# Patient Record
Sex: Female | Born: 1945 | Race: White | Marital: Married | Smoking: Former smoker
Health system: Southern US, Community
[De-identification: ages and names within clinical notes are randomized; demographics above are authoritative.]

## PROBLEM LIST (undated history)

## (undated) DIAGNOSIS — K56609 Unspecified intestinal obstruction, unspecified as to partial versus complete obstruction: Secondary | ICD-10-CM

## (undated) DIAGNOSIS — G4733 Obstructive sleep apnea (adult) (pediatric): Secondary | ICD-10-CM

## (undated) DIAGNOSIS — G4730 Sleep apnea, unspecified: Secondary | ICD-10-CM

## (undated) DIAGNOSIS — T7840XA Allergy, unspecified, initial encounter: Secondary | ICD-10-CM

## (undated) DIAGNOSIS — I4510 Unspecified right bundle-branch block: Secondary | ICD-10-CM

## (undated) DIAGNOSIS — I10 Essential (primary) hypertension: Secondary | ICD-10-CM

## (undated) DIAGNOSIS — M797 Fibromyalgia: Secondary | ICD-10-CM

## (undated) DIAGNOSIS — H409 Unspecified glaucoma: Secondary | ICD-10-CM

## (undated) DIAGNOSIS — F419 Anxiety disorder, unspecified: Secondary | ICD-10-CM

## (undated) DIAGNOSIS — M8580 Other specified disorders of bone density and structure, unspecified site: Secondary | ICD-10-CM

## (undated) DIAGNOSIS — M1990 Unspecified osteoarthritis, unspecified site: Secondary | ICD-10-CM

## (undated) DIAGNOSIS — F319 Bipolar disorder, unspecified: Secondary | ICD-10-CM

## (undated) DIAGNOSIS — E785 Hyperlipidemia, unspecified: Secondary | ICD-10-CM

## (undated) HISTORY — DX: Hyperlipidemia, unspecified: E78.5

## (undated) HISTORY — DX: Other specified disorders of bone density and structure, unspecified site: M85.80

## (undated) HISTORY — PX: CHOLECYSTECTOMY: SHX55

## (undated) HISTORY — PX: COLONOSCOPY: SHX174

## (undated) HISTORY — PX: GASTRIC BYPASS: SHX52

## (undated) HISTORY — PX: TUBAL LIGATION: SHX77

## (undated) HISTORY — DX: Unspecified osteoarthritis, unspecified site: M19.90

## (undated) HISTORY — DX: Obstructive sleep apnea (adult) (pediatric): G47.33

## (undated) HISTORY — DX: Fibromyalgia: M79.7

## (undated) HISTORY — PX: CATARACT EXTRACTION: SUR2

## (undated) HISTORY — DX: Anxiety disorder, unspecified: F41.9

## (undated) HISTORY — DX: Unspecified intestinal obstruction, unspecified as to partial versus complete obstruction: K56.609

## (undated) HISTORY — DX: Bipolar disorder, unspecified: F31.9

## (undated) HISTORY — DX: Sleep apnea, unspecified: G47.30

## (undated) HISTORY — DX: Essential (primary) hypertension: I10

## (undated) HISTORY — DX: Unspecified glaucoma: H40.9

## (undated) HISTORY — DX: Unspecified right bundle-branch block: I45.10

## (undated) HISTORY — DX: Allergy, unspecified, initial encounter: T78.40XA

---

## 2011-09-04 ENCOUNTER — Inpatient Hospital Stay (HOSPITAL_COMMUNITY)
Admission: EM | Admit: 2011-09-04 | Discharge: 2011-09-05 | DRG: 390 | Disposition: A | Discharge status: Home / Self Care | Payer: No Typology Code available for payment source | Attending: General Surgery | Admitting: General Surgery

## 2011-09-04 ENCOUNTER — Emergency Department (HOSPITAL_COMMUNITY): Payer: No Typology Code available for payment source

## 2011-09-04 DIAGNOSIS — IMO0001 Reserved for inherently not codable concepts without codable children: Secondary | ICD-10-CM | POA: Diagnosis present

## 2011-09-04 DIAGNOSIS — Z87891 Personal history of nicotine dependence: Secondary | ICD-10-CM

## 2011-09-04 DIAGNOSIS — I10 Essential (primary) hypertension: Secondary | ICD-10-CM | POA: Diagnosis present

## 2011-09-04 DIAGNOSIS — R109 Unspecified abdominal pain: Secondary | ICD-10-CM

## 2011-09-04 DIAGNOSIS — G4733 Obstructive sleep apnea (adult) (pediatric): Secondary | ICD-10-CM | POA: Diagnosis present

## 2011-09-04 DIAGNOSIS — K56609 Unspecified intestinal obstruction, unspecified as to partial versus complete obstruction: Principal | ICD-10-CM | POA: Diagnosis present

## 2011-09-04 DIAGNOSIS — F319 Bipolar disorder, unspecified: Secondary | ICD-10-CM | POA: Diagnosis present

## 2011-09-04 LAB — DIFFERENTIAL
Basophils Absolute: 0 10*3/uL (ref 0.0–0.1)
Basophils Relative: 0 % (ref 0–1)
Eosinophils Absolute: 0.1 10*3/uL (ref 0.0–0.7)
Eosinophils Relative: 1 % (ref 0–5)
Monocytes Absolute: 0.7 10*3/uL (ref 0.1–1.0)
Monocytes Relative: 7 % (ref 3–12)

## 2011-09-04 LAB — COMPREHENSIVE METABOLIC PANEL
ALT: 18 U/L (ref 0–35)
Albumin: 4 g/dL (ref 3.5–5.2)
Calcium: 9.9 mg/dL (ref 8.4–10.5)
GFR calc Af Amer: >60 mL/min (ref >60)
Glucose, Bld: 111 mg/dL — ABNORMAL HIGH (ref 70–99)
Potassium: 4 mEq/L (ref 3.5–5.1)
Sodium: 139 mEq/L (ref 135–145)
Total Protein: 7.7 g/dL (ref 6.0–8.3)

## 2011-09-04 LAB — URINALYSIS, ROUTINE W REFLEX MICROSCOPIC
Bilirubin Urine: NEGATIVE
Hgb urine dipstick: NEGATIVE
Nitrite: NEGATIVE
Protein, ur: NEGATIVE mg/dL
Specific Gravity, Urine: 1.034 — ABNORMAL HIGH (ref 1.005–1.030)
Urobilinogen, UA: 0.2 mg/dL (ref 0.0–1.0)

## 2011-09-04 LAB — CBC
Hemoglobin: 13.9 g/dL (ref 12.0–15.0)
MCH: 30.5 pg (ref 26.0–34.0)
MCHC: 34.4 g/dL (ref 30.0–36.0)
Platelets: 346 10*3/uL (ref 150–400)
RDW: 13 % (ref 11.5–15.5)

## 2011-09-04 MED ORDER — IOHEXOL 300 MG/ML  SOLN
100.0000 mL | Freq: Once | INTRAMUSCULAR | Status: AC | PRN
  Administered 2011-09-04: 100 mL via INTRAVENOUS

## 2011-09-05 ENCOUNTER — Inpatient Hospital Stay (HOSPITAL_COMMUNITY): Payer: No Typology Code available for payment source

## 2011-09-05 LAB — DIFFERENTIAL
Basophils Absolute: 0 10*3/uL (ref 0.0–0.1)
Lymphocytes Relative: 24 % (ref 12–46)
Lymphs Abs: 1.2 10*3/uL (ref 0.7–4.0)
Monocytes Absolute: 0.5 10*3/uL (ref 0.1–1.0)
Neutro Abs: 3.2 10*3/uL (ref 1.7–7.7)

## 2011-09-05 LAB — CBC
HCT: 35.1 % — ABNORMAL LOW (ref 36.0–46.0)
Hemoglobin: 11.9 g/dL — ABNORMAL LOW (ref 12.0–15.0)
MCHC: 33.9 g/dL (ref 30.0–36.0)
MCV: 88.6 fL (ref 78.0–100.0)
WBC: 5.2 10*3/uL (ref 4.0–10.5)

## 2011-09-05 LAB — BASIC METABOLIC PANEL
BUN: 7 mg/dL (ref 6–23)
Chloride: 105 mEq/L (ref 96–112)
Glucose, Bld: 123 mg/dL — ABNORMAL HIGH (ref 70–99)
Potassium: 3.7 mEq/L (ref 3.5–5.1)

## 2011-09-11 NOTE — Discharge Summary (Signed)
NAMECIMONE, FAHEY                 ACCOUNT NO.:  [id number]  MEDICAL RECORD NO.:  [id number]  LOCATION:  1523                         FACILITY:  [facility]  PHYSICIAN:  Sandria Bales. Ezzard Standing, M.D.  DATE OF BIRTH:  1946/02/27  DATE OF ADMISSION:  09/04/2011 DATE OF DISCHARGE:  09/05/2011                              DISCHARGE SUMMARY   PRIMARY CARE PHYSICIAN:  Dr. Corbin Ade in [place], [place].  ADMISSION DIAGNOSIS:  Abdominal pain with small bowel obstruction.  DISCHARGE DIAGNOSIS:  Abdominal pain with small bowel obstruction  ADDITIONAL DIAGNOSES: 1. History of bipolar disease with panic attacks. 2. Hiatal hernia/gastroesophageal reflux disease/irritable bowel     syndrome. 3. Fibromyalgia. 4. Sleep apnea, she is intolerant to CPAP. 5. BMI of 38. 6. Status post cholecystectomy, status post gastric banding with     reversal and C-section x2.  BRIEF HISTORY:  The patient is a very pleasant 65 year old female who developed some cramping and some bloating, Friday of last week.  She is able to eat some on Saturday.  Her symptoms persisted and got worse Sunday, was worse than Saturday.  She eventually went to bed and woke up, Monday a.m., around 3:30 with nausea and vomiting.  She had a great deal of discomfort, trouble lying down.  She finally got back to bed and woke up again at 5:30 a.m., vomiting again.  Her husband was actually brought to [facility] this morning for an elective nephrectomy for a renal cancer.  Both children are with their parents and the kids brought their mother to the emergency room and their dad went to the OR for surgery.  She has had no further vomiting since 5:30 a.m.  She had a bowel movement early this morning before her first CT and both Saturday and Sunday prior to admission.  MEDICATIONS ON ADMISSION: 1. Ultram 50 mg q.i.d. p.r.n. 2. Hydrocodone 7.5/325 p.r.n. 3. Equetro 800 mg p.o. h.s. 4. Naprosyn 500 mg b.i.d. p.r.n. 5. Zantac p.r.n., she  has just started this. 6. Losartan 50 mg b.i.d. 7. Ambien 10 mg h.s. 8. Metoprolol ER 100 mg h.s. 9. Lamictal 200 mg p.o. h.s. 10.Phenergan 25 mg half tablet q.6 p.r.n. 11.Valium 5 mg 1-2 p.o. b.i.d. p.r.n. for anxiety.  HOSPITAL COURSE:  The patient is a 65 year old female who presented to the ER with nausea and vomiting.  X-rays in the ER showed dilated small bowel loops in the left upper quadrant with air-fluid levels.  She then underwent a CT scan, which showed a small bowel obstruction within the left lower quadrant, left pelvis, most likely due to adhesions.  There is no mass that was seen.  There was no small hiatal hernia and there were multiple rectosigmoid diverticula.  No diverticulitis.  She was seen in the ER by Dr. Ezzard Standing and admitted for small bowel obstruction. She was placed on the floor, she had no further nausea and vomiting, so an NG was not inserted.  She was placed on bowel rest and IV hydration. She did well overnight and had a bowel movement this morning.  A repeat 2-view abdomen was obtained, which showed interval resolution of dilated small bowel in the left side  of the abdomen, bowel pattern was normal. There was residual oral contrast from the CT within the decompressed colon.  Surgical clips in the right upper quadrant from the prior cholecystectomy, surgical anastomotic suture material in the left upper quadrant from a previous gastric bypass.  There was no evidence of intraperitoneal air or air-fluid levels.  She does have degenerative changes in the lower lumbar spine and the right sacroiliac joint.  The patient was seen in the a.m. of September 05, 2011, by Dr. Ezzard Standing, she was doing well and had a bowel movement and her x-ray was improved.  He placed her on liquids.  She has been able to tolerate some clear liquids, she had a little bit of cream soup.    She is tolerating this all well.  Her children need to back to work and they would like to go  home where she can resume her fibromyalgia regime.  She will plan to come back tomorrow with friends, if she is doing well, to see her husband. She was instructed to maintain a full liquid diet in the next 48-72 hours and then low-residual diet for at least a week after that until she is completely back to normal.  I recommended she call Dr. Nelson Chimes for followup in [place] as soon she gets home tomorrow.  We will see her back on a p.r.n. basis should she needs.  DISCHARGE MEDICATIONS:  She is going to resume all her preadmission meds as before, 1. Carbamazepine XR 200 mg 4 tablets h.s. 2. Hydrocodone 7.5/500, one 5 times a day as needed. 3. Lamotrigine 200 mg 2 tablets daily h.s. 4. Losartan 50 mg h.s. 5. Metoprolol XL 100 mg daily. 6. Naprosyn 500 mg 1 tablet b.i.d. p.r.n. 7. She has nystatin and steroid cream she applies on her arms daily. 8. Tramadol 50 mg 2 tablets p.r.n. for pain. 9. Valium 5 mg 1-2 t.i.d. p.r.n. 10.Zantac 75 mg daily.  CONDITION ON DISCHARGE:  Improved.   Eber Hong, P.A.   Sandria Bales. Ezzard Standing, M.D., FACS   WDJ/MEDQ  D:  09/05/2011  T:  09/06/2011  Job:  [id number]  cc:   Dr. Henrietta Dine, [place]  Electronically Signed by Sherrie George P.A. on 09/07/2011 10:19:07 PM Electronically Signed by Ovidio Kin M.D. on 09/11/2011 09:17:54 AM

## 2011-09-11 NOTE — H&P (Signed)
[name], [name]                 ACCOUNT NO.:  [id number]  MEDICAL RECORD NO.:  [id number]  LOCATION:  1523                         FACILITY:  [facility]  PHYSICIAN:  Sandria Bales. Ezzard Standing, M.D.  DATE OF BIRTH:  05-21-1946  DATE OF ADMISSION:  09/04/2011                             HISTORY & PHYSICAL   REQUESTING PHYSICIAN:  Trudi Ida. Denton Lank, M.D.  REASON FOR ADMISSION:  Abdominal pain with small bowel obstruction.  BRIEF HISTORY:  The patient is a 65 year old white female who developed some crampiness and bloated feeling on Friday.  She was able to eat some on Saturday, but the symptoms persisted and got worse, Sunday she was even worse than Saturday.  She ultimately went to bed, but woke up around 3:30 a.m. this morning with nausea and vomiting.  It hurts her bad, she had trouble lying down.  She vomited again this morning around 5:30 a.m.  She has not eaten since 3:30 a.m.  Her husband actually was brought to [facility] this morning for elective nephrectomy secondary to renal cancer.  Both children are with their parents and the kids brought her to the emergency room here at [facility] first.  The patient's pain and nausea have resolved.  She has had no further vomiting since 5:30 a.m.  She had bowel movements, both on Saturday and Sunday and reports she actually had one this morning before she had the CT scan.  PAST MEDICAL HISTORY: 1. Bipolar. 2. History of fibromyalgia. 3. Panic attacks. 4. Irritable bowel syndrome, mostly related to stress. 5. Hypertension. 6. History of palpitations with what sounds like a normal stress test. 7. Obstructive sleep apnea.  She had a CPAP, but with panic attacks     and bipolar she cannot use it.  She has on average about 1 panic     attack per month.  PAST SURGICAL HISTORY: 1. Cholecystectomy. 2. 2 C-sections. 3. Gastric banding reversal. 4. History of tubal ligations.  FAMILY HISTORY:  Father died at 59 with diabetes and  coronary artery disease.  Mother died at 56 with pneumonia and had lobectomy, psychiatric issues.  No brothers or sisters.  SOCIAL HISTORY:  Tobacco, she smoked 31 years, less than a pack a day, none for the last 13 years.  Alcohol, social.  Drugs none.  She is a retired [employer] officer.  REVIEW OF SYSTEMS:   FEVER:  None.  CV:  Positive for headache today, otherwise negative.  No history of stroke, seizure, syncope, or presyncope.  WEIGHT:  She says she actually slowly gaining weight. PSYCH:  Positive for bipolar disease somewhat worse with stress. CARDIAC:  No chest pain.  No palpitations.   PULMONARY:  No orthopnea. No PND.  She does have dyspnea on exertion.  No coughing or wheezing. No recent URI.  GI:  Positive for nausea and vomiting.  Positive for diarrhea when she is anxious.  No constipation.  Positive for GERD.  No blood in her stool.  GU:  No trouble voiding.   LOWER EXTREMITIES: Positive for edema, she thinks.  No claudication.  She has pain in her knees.  She has mild arthritis and fibromyalgia, which she says aches  all over. CURRENT MEDICATIONS: 1. Ultram 50 mg p.o. q.i.d. p.r.n. 2. Hydrocodone 7.5/325 q.4 p.r.n. 3. Equetro 800 mg p.o. h.s. 4. Naprosyn 500 mg b.i.d. p.r.n. 5. Zantac, she just started p.r.n. 6. Losartan 50 mg daily. 7. Ambien 10 mg h.s. 8. Metoprolol ER 100 mg daily. 9. Lamictal 200 mg p.o. h.s. 10.Phenergan 25 mg half tablet q.6 p.r.n. 11.Valium 5 mg 1-2 p.o. b.i.d. p.r.n. anxiety.  ALLERGIES:  PENICILLIN.  PHYSICAL EXAMINATION:  GENERAL:  This is a well-nourished, well- developed white female, in no acute distress. VITAL SIGNS:  Temperature is 98.3, heart rate is 71, blood pressure is 136/73, respiratory rate is 16, sats are 100% on room air. HEAD:  Normocephalic. EYES, EARS, NOSE, THROAT, MOUTH:  Grossly within normal limits.  Mucosa is normal.  Trachea is in the midline.  Thyroid is nonpalpable.  There is no JVD.  No bruits. CHEST:  Clear to  auscultation and percussion.  Nontender to palpation. CARDIAC:  Normal S1, S2.  No murmurs, rubs, or gallops. ABDOMEN:  Soft.  Positive bowel sounds.  No palpable hepatosplenomegaly. Abdomen is currently nontender.  There are no hernia, masses, or abscesses.  There are midline incision scars above and below the umbilicus.  GU/RECTAL:  Deferred. LYMPH:  Lymphadenopathy nonpalpated, axillary, cervical, or femoral. SKIN:  No significant changes. NEUROLOGIC:  No focal deficits.  Cranial nerves II through XII are grossly intact. PSYCH:  Normal affect.  LABORATORY DATA:  White count is 9.1, hemoglobin is 13.9, hematocrit is 40.4, and platelets are 346,000.  Sodium is 139, potassium is 4.0, chloride is 102, CO2 is 25, BUN is 15, creatinine is 0.56, glucose is 111, total bilirubin 0.3, alk phos 103, SGOT/SGPT are both 18.  UA is normal.  IMAGING STUDIES:  CT shows bibasilar atelectasis.  There is a small hiatal hernia.  The liver, kidneys, pancreas are normal.  She has clips from prior cholecystectomy, a small bowel is dilated in the left upper quadrant and upper pelvis.  There are multiple rectosigmoid diverticuli. It was their impression she had a small bowel obstruction secondary to adhesions.  IMPRESSION: 1. Small bowel obstruction. 2. History of bipolar disease with panic attacks. 3. Hiatal hernia/gastroesophageal reflux disease/irritable bowel     syndrome. 4. Fibromyalgia. 5. Obstructive sleep apnea.  She is intolerant to CPAP. 6. BMI of 38. 7. Status post cholecystectomy, gastric banding with reversal and C-     section x2.  PLAN:  We are going to keep her n.p.o., she has recurrent nausea or vomiting, we will place an NG, we will put her on bowel rest, IV fluids, and IV meds with further treatment as indicated.   Eber Hong, P.A.   Sandria Bales. Ezzard Standing, M.D., FACS   WDJ/MEDQ  D:  09/04/2011  T:  09/04/2011  Job:  [id number]  Electronically Signed by Sherrie George  P.A. on 09/07/2011 10:16:44 PM Electronically Signed by Ovidio Kin M.D. on 09/11/2011 09:17:05 AM

## 2011-09-18 ENCOUNTER — Observation Stay (HOSPITAL_COMMUNITY)
Admission: EM | Admit: 2011-09-18 | Discharge: 2011-09-20 | Disposition: A | Discharge status: Home / Self Care | Payer: No Typology Code available for payment source | Attending: Surgery | Admitting: Surgery

## 2011-09-18 ENCOUNTER — Inpatient Hospital Stay (HOSPITAL_COMMUNITY): Payer: No Typology Code available for payment source

## 2011-09-18 ENCOUNTER — Emergency Department (HOSPITAL_COMMUNITY): Payer: No Typology Code available for payment source

## 2011-09-18 DIAGNOSIS — R142 Eructation: Secondary | ICD-10-CM | POA: Insufficient documentation

## 2011-09-18 DIAGNOSIS — R109 Unspecified abdominal pain: Secondary | ICD-10-CM | POA: Insufficient documentation

## 2011-09-18 DIAGNOSIS — G4733 Obstructive sleep apnea (adult) (pediatric): Secondary | ICD-10-CM | POA: Insufficient documentation

## 2011-09-18 DIAGNOSIS — IMO0001 Reserved for inherently not codable concepts without codable children: Secondary | ICD-10-CM | POA: Insufficient documentation

## 2011-09-18 DIAGNOSIS — K449 Diaphragmatic hernia without obstruction or gangrene: Secondary | ICD-10-CM | POA: Insufficient documentation

## 2011-09-18 DIAGNOSIS — I10 Essential (primary) hypertension: Secondary | ICD-10-CM | POA: Insufficient documentation

## 2011-09-18 DIAGNOSIS — F319 Bipolar disorder, unspecified: Secondary | ICD-10-CM | POA: Insufficient documentation

## 2011-09-18 DIAGNOSIS — Z79899 Other long term (current) drug therapy: Secondary | ICD-10-CM | POA: Insufficient documentation

## 2011-09-18 DIAGNOSIS — K56609 Unspecified intestinal obstruction, unspecified as to partial versus complete obstruction: Principal | ICD-10-CM | POA: Insufficient documentation

## 2011-09-18 DIAGNOSIS — R141 Gas pain: Secondary | ICD-10-CM | POA: Insufficient documentation

## 2011-09-18 DIAGNOSIS — Z9884 Bariatric surgery status: Secondary | ICD-10-CM | POA: Insufficient documentation

## 2011-09-18 DIAGNOSIS — R112 Nausea with vomiting, unspecified: Secondary | ICD-10-CM | POA: Insufficient documentation

## 2011-09-18 DIAGNOSIS — F410 Panic disorder [episodic paroxysmal anxiety] without agoraphobia: Secondary | ICD-10-CM | POA: Insufficient documentation

## 2011-09-18 LAB — DIFFERENTIAL
Basophils Absolute: 0 K/uL (ref 0.0–0.1)
Basophils Relative: 0 % (ref 0–1)
Eosinophils Absolute: 0 K/uL (ref 0.0–0.7)
Eosinophils Relative: 0 % (ref 0–5)
Lymphocytes Relative: 11 % — ABNORMAL LOW (ref 12–46)
Lymphs Abs: 1.1 10*3/uL (ref 0.7–4.0)
Monocytes Absolute: 0.6 10*3/uL (ref 0.1–1.0)
Monocytes Relative: 5 % (ref 3–12)
Neutro Abs: 8.9 K/uL — ABNORMAL HIGH (ref 1.7–7.7)
Neutrophils Relative %: 83 % — ABNORMAL HIGH (ref 43–77)

## 2011-09-18 LAB — CBC
HCT: 42.6 % (ref 36.0–46.0)
Hemoglobin: 14.5 g/dL (ref 12.0–15.0)
MCH: 30.3 pg (ref 26.0–34.0)
MCHC: 34 g/dL (ref 30.0–36.0)
MCV: 89.1 fL (ref 78.0–100.0)
Platelets: 383 K/uL (ref 150–400)
RBC: 4.78 MIL/uL (ref 3.87–5.11)
RDW: 12.8 % (ref 11.5–15.5)
WBC: 10.7 K/uL — ABNORMAL HIGH (ref 4.0–10.5)

## 2011-09-18 LAB — URINALYSIS, ROUTINE W REFLEX MICROSCOPIC
Bilirubin Urine: NEGATIVE
Glucose, UA: NEGATIVE mg/dL
Hgb urine dipstick: NEGATIVE
Ketones, ur: NEGATIVE mg/dL
Leukocytes, UA: NEGATIVE
Nitrite: NEGATIVE
Protein, ur: NEGATIVE mg/dL
Specific Gravity, Urine: 1.018 (ref 1.005–1.030)
Urobilinogen, UA: 0.2 mg/dL (ref 0.0–1.0)
pH: 7.5 (ref 5.0–8.0)

## 2011-09-18 LAB — COMPREHENSIVE METABOLIC PANEL WITH GFR
ALT: 16 U/L (ref 0–35)
AST: 22 U/L (ref 0–37)
Alkaline Phosphatase: 106 U/L (ref 39–117)
CO2: 30 meq/L (ref 19–32)
Calcium: 10.6 mg/dL — ABNORMAL HIGH (ref 8.4–10.5)
Chloride: 99 meq/L (ref 96–112)
GFR calc Af Amer: >90 mL/min (ref >90)
GFR calc non Af Amer: 88 mL/min — ABNORMAL LOW (ref >90)
Glucose, Bld: 108 mg/dL — ABNORMAL HIGH (ref 70–99)
Potassium: 4.2 meq/L (ref 3.5–5.1)
Sodium: 139 meq/L (ref 135–145)

## 2011-09-18 LAB — LIPASE, BLOOD: Lipase: 21 U/L (ref 11–59)

## 2011-09-18 LAB — COMPREHENSIVE METABOLIC PANEL
Albumin: 4.1 g/dL (ref 3.5–5.2)
BUN: 15 mg/dL (ref 6–23)
Creatinine, Ser: 0.73 mg/dL (ref 0.50–1.10)
Total Bilirubin: 0.3 mg/dL (ref 0.3–1.2)
Total Protein: 8.3 g/dL (ref 6.0–8.3)

## 2011-09-19 ENCOUNTER — Inpatient Hospital Stay (HOSPITAL_COMMUNITY): Payer: No Typology Code available for payment source

## 2011-10-09 NOTE — H&P (Signed)
NAMEANALI, CABANILLA                 ACCOUNT NO.:  [id number]  MEDICAL RECORD NO.:  [id number]  LOCATION:  1526                         FACILITY:  [facility]  PHYSICIAN:  Anselm Pancoast. Kamareon Sciandra, M.D.DATE OF BIRTH:  1946/06/30  DATE OF ADMISSION:  09/18/2011 DATE OF DISCHARGE:                             HISTORY & PHYSICAL   PRIMARY CARE PHYSICIAN:  Dr. Corbin Ade, [place].  CHIEF COMPLAINT:  Nausea, vomiting, and abdominal pain.  HISTORY OF PRESENT ILLNESS:  [name] is a 65 year old white female, who has had a recent partial small bowel obstruction with admission here at [facility] approximately 2 weeks ago.  She has had multiple previous abdominal surgeries including gastric bypass surgery and ultimate reversal.  Apparently last time, she was only here for approximately 24 to 48 hours and dramatically improved and was discharged.  Last night around the 11:00 pm, the patient developed what she describesas crampy upper abdominal pain.  This kept her up all night and around 5:30 this morning, the patient developed emesis.  It eventually was bilious in nature.  She is still passing flatus and has actually had 2 soft bowel movements while here in the emergency department.  Upon arrival, she did have x-rays obtained, which revealed a partial small bowel obstruction.  Because of this, we have been asked to evaluate the patient for admission.  REVIEW OF SYSTEMS:  Please see HPI, otherwise all other systems have been reviewed and are negative.  FAMILY HISTORY:  Noncontributory.  PAST MEDICAL HISTORY: 1. Bipolar disorder. 2. Fibromyalgia. 3. Hypertension. 4. Anxiety disorder. 5. Obstructive sleep apnea.  PAST SURGICAL HISTORY: 1. C-section x2. 2. Tubal ligation. 3. Gastric bypass surgery. 4. Gastric bypass reversal with open cholecystectomy at the same time. 5. Bilateral venous ligation of her lower extremities.  SOCIAL HISTORY:  The patient is married with 2  children.  She is retired.  She quit smoking approximately 14 years ago.  She rarely uses alcohol.  She denies any illicit drug abuse.  ALLERGIES:  PENICILLIN.  MEDICATIONS AT HOME:  Include: 1. Carbamazepine 200 mg at bedtime. 2. Vicodin 7.5/500 as needed for pain. 3. Lamotrigine 200 mg at bedtime. 4. Losartan 50 mg daily. 5. Metoprolol tartrate 100 mg daily. 6. Naprosyn 500 mg b.i.d. 7. Nystatin as needed. 8. Ultram 50 mg q.i.d. 9. Valium 5 mg twice a day. 10.Zantac 75 mg p.r.n. 11.Zolpidem 10 mg p.r.n.  PHYSICAL EXAMINATION:  GENERAL:  Ms. Broman is a pleasant 65 year old white female, who is currently lying in bed, in no acute distress. VITAL SIGNS:  Temperature 98.1, pulse 94, blood pressure 130/74, respirations 20. HEENT:  Head is normocephalic, atraumatic.  Sclerae noninjected.  Pupils are equal, round, and reactive to light.  Ears and nose without any obvious masses or lesions.  No rhinorrhea.  Mouth is pink.  Throat shows no exudate. HEART:  Regular rate and rhythm.  Normal S1-S2.  No murmurs, gallops, or rubs are noted.  She does have palpable carotid, radial, and pedal pulses bilaterally. LUNGS:  Clear to auscultation bilaterally with no wheezes, rhonchi, or rales noted.  Respiratory effort is nonlabored. ABDOMEN:  Soft and obese.  She does have almost hyperactive  bowel sounds.  She is, otherwise, nontender and nondistended.  She does have a large upper midline incision from her prior laparotomy.  Otherwise, no masses, hernias, or organomegaly are noted. MUSCULOSKELETAL:  All 4 extremities are symmetrical.  No cyanosis or clubbing.  She does have some mild trace pitting edema of her left lower extremity. SKIN:  Warm and dry with no masses, lesions, or rashes. PSYCH:  The patient is alert and oriented x3 with an appropriate affect.  LABS:  White blood cell count is 10,700, hemoglobin 14.5, hematocrit 42.6, platelet count is 383,000.  Sodium 139, potassium 4.2,  glucose 108, BUN 15, creatinine of 0.73.  DIAGNOSTICS:  X-ray, acute abdominal series reveals partial small bowel obstruction.  IMPRESSION: 1. Recurrent partial small bowel obstruction. 2. Bipolar disorder. 3. Anxiety disorder. 4. Hypertension. 5. Obstructive sleep apnea.  PLAN:  At this time, we will get the patient admitted.  Given the fact that this is her second admission within 2 weeks for a recurrent partial small bowel obstruction.  We will obtain an upper GI with small bowel follow-through to evaluate for chronic stricturing or stenosis from either scar tissue or strictures at her prior anastomoses.  In the meantime, we will keep her NPO, start her on IV fluids.  If we will be able to avoid operative intervention, however, if the patient does not improve, this is the possibility.     Letha Cape, PA   ______________________________ Anselm Pancoast. Zachery Dakins, M.D.    KEO/MEDQ  D:  09/18/2011  T:  09/19/2011  Job:  [id number]  cc:   Corbin Ade, MD  Electronically Signed by Barnetta Chapel PA on 09/28/2011 05:37:49 PM Electronically Signed by Consuello Bossier M.D. on 10/09/2011 11:24:07 AM

## 2012-10-12 IMAGING — CR DG ABDOMEN ACUTE W/ 1V CHEST
4 series · 4 of 4 positions shown · non-contrast
Comparison: None.

CLINICAL DATA: Left upper abdominal pain.  Bilious vomiting.

ACUTE ABDOMEN SERIES (ABDOMEN 2 VIEW & CHEST 1 VIEW)

[w chest pa]
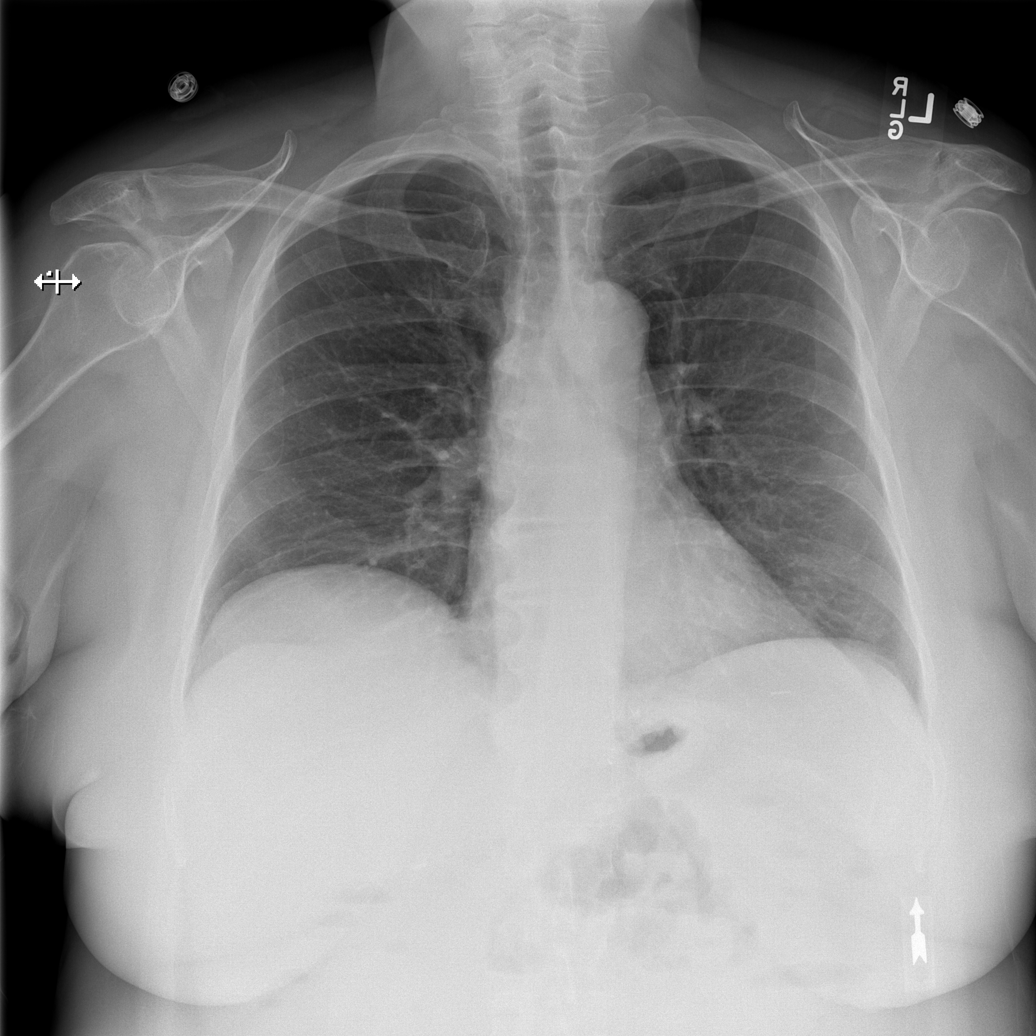

[w abdomen upright]
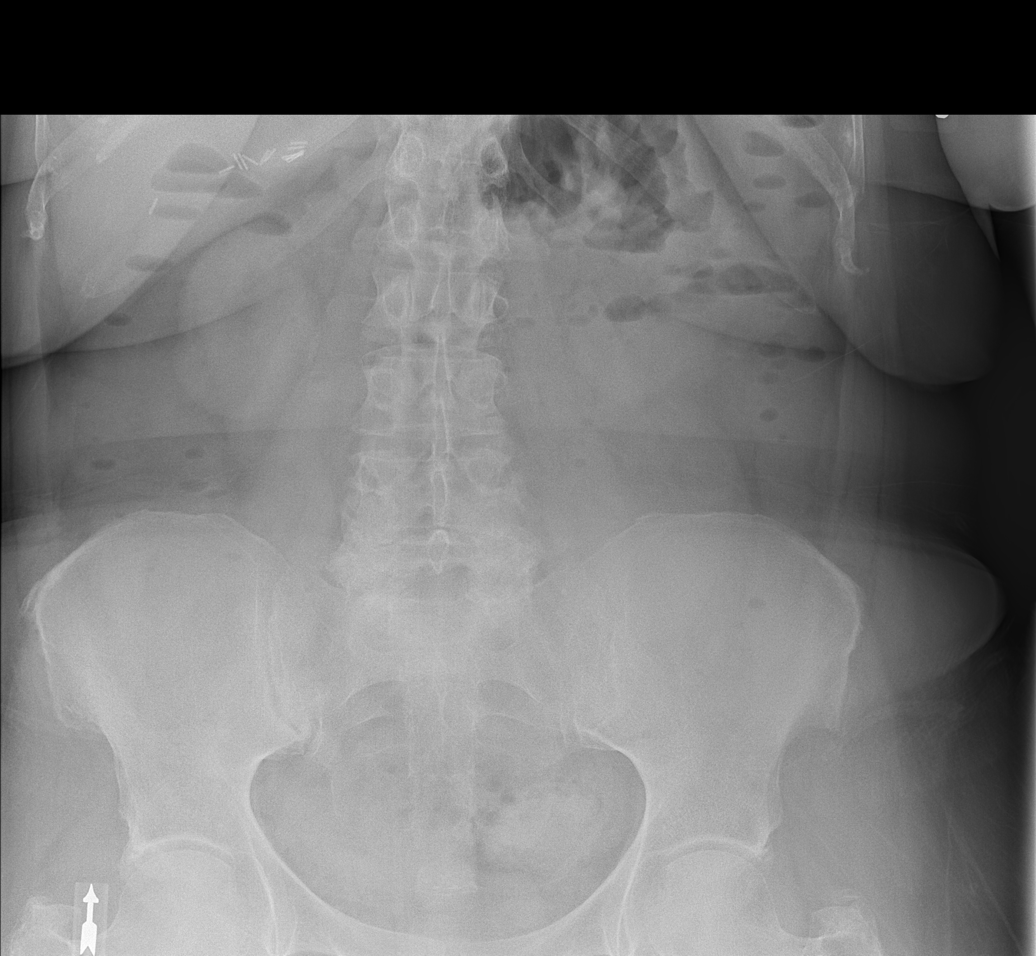

[t abdomen supine (1 of 2)]
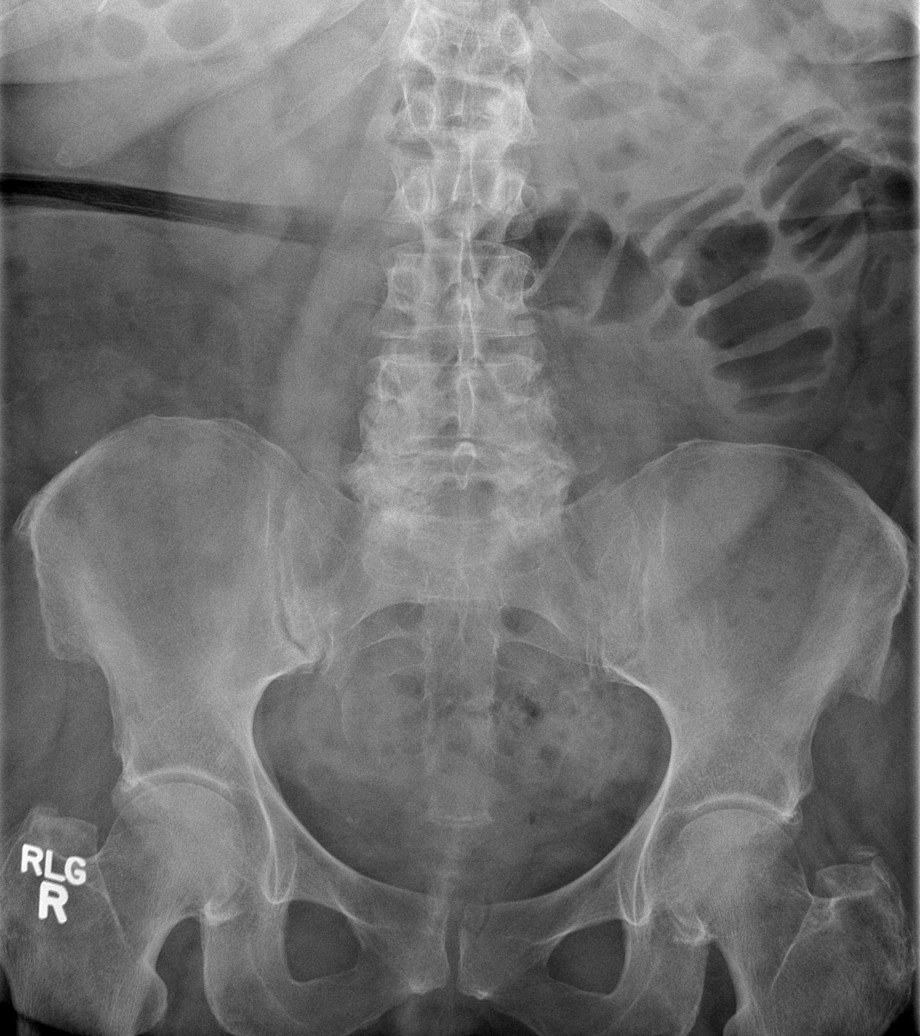

[t abdomen supine (2 of 2)]
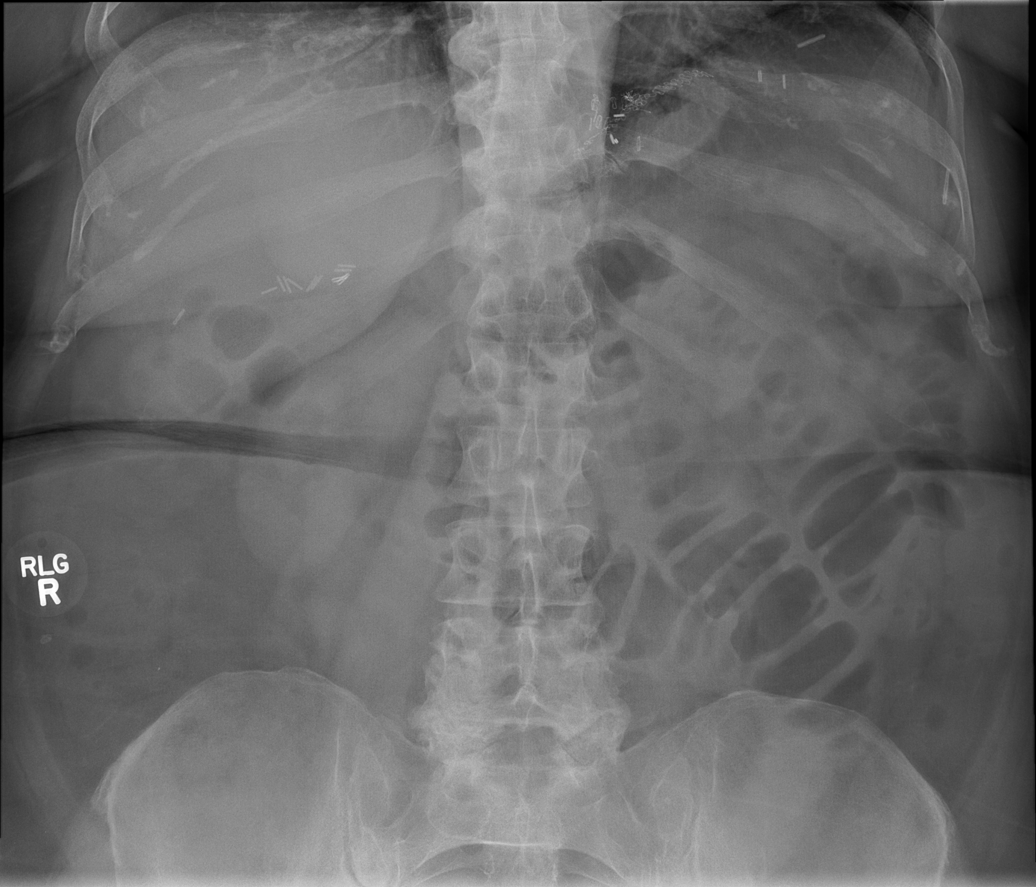

[4 of 4 positions shown; findings below may reference images not displayed]

FINDINGS: Dilated small bowel loops are seen in the left upper
quadrant with air-fluid levels.  Findings concerning for small
bowel obstruction.  Surgical clips in the upper abdomen
bilaterally.  Presumed prior cholecystectomy.  The patient also
reports previous gastric bypass.  No free air organomegaly.  No
suspicious calcification or acute bony abnormality.  Degenerative
changes in the lower lumbar spine and hips.

Heart and mediastinal contours are within normal limits.  No focal
opacities or effusions.  No acute bony abnormality.
IMPRESSION: Dilated left abdominal small bowel loops with air-fluid levels
compatible with small bowel obstruction.  Given the history of
prior gastric bypass, I would recommend CT of the abdomen and
pelvis with IV contrast to exclude internal hernia or closed-loop
obstruction.

## 2012-10-12 IMAGING — CT CT ABD-PELV W/ CM
1 of 3 series · 14 of 32 positions shown, 19 images · IV contrast (APPLIED)
Comparison: Chest and acute abdomen of 09/04/2011

CLINICAL DATA: Epigastric pain, nausea and vomiting, history of
gastric bypass surgery

CT ABDOMEN AND PELVIS WITH CONTRAST
TECHNIQUE: Multidetector CT imaging of the abdomen and pelvis was
performed following the standard protocol during bolus
administration of intravenous contrast.
Contrast: 100mL OMNIPAQUE IOHEXOL 300 MG/ML IV SOLN

[Series 2: abd/pel with · axial · 0.87mm/px · z∈[-279,+126]mm · 14 of 91 slices shown, 19 images]
[im 5/91  soft-tissue]
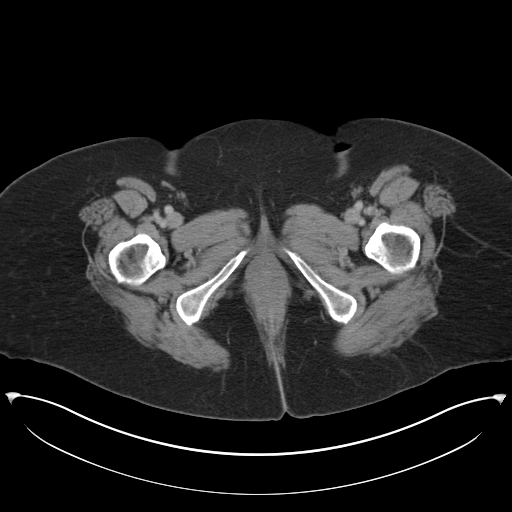
[im 5/91  bone]
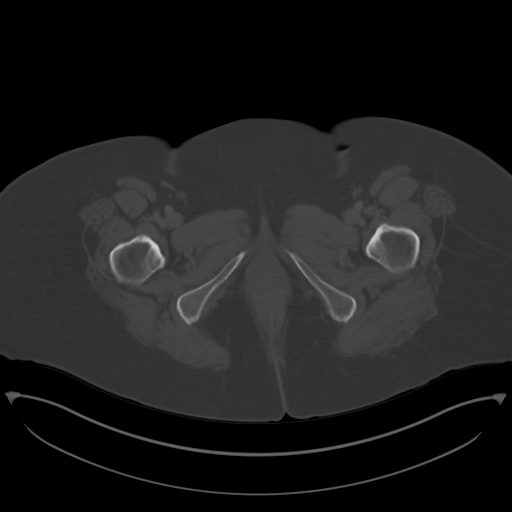
[im 15/91  soft-tissue]
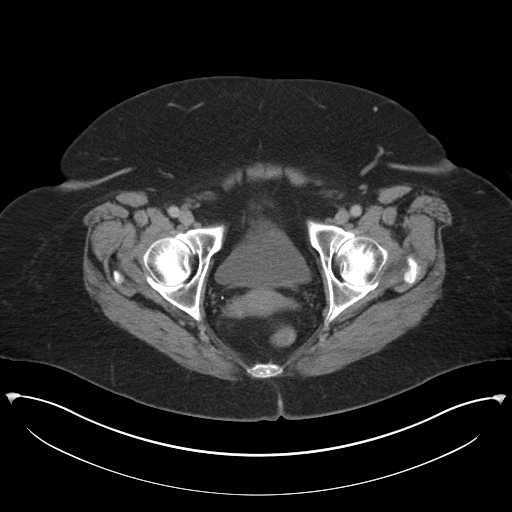
[im 19/91  soft-tissue]
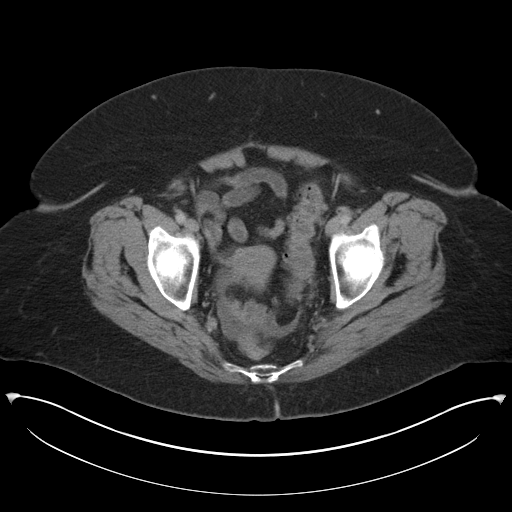
[im 24/91  soft-tissue]
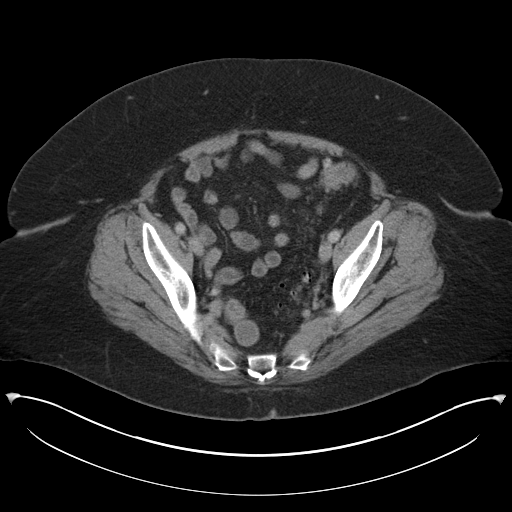
[im 34/91  soft-tissue]
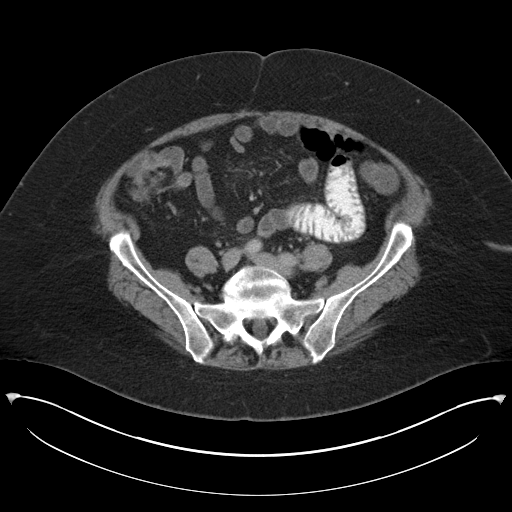
[im 38/91  soft-tissue]
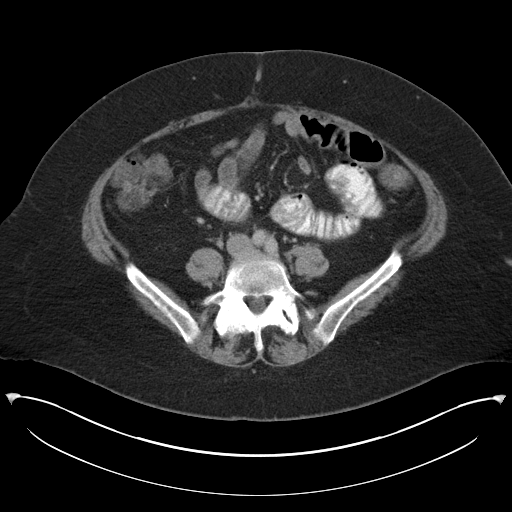
[im 48/91  soft-tissue]
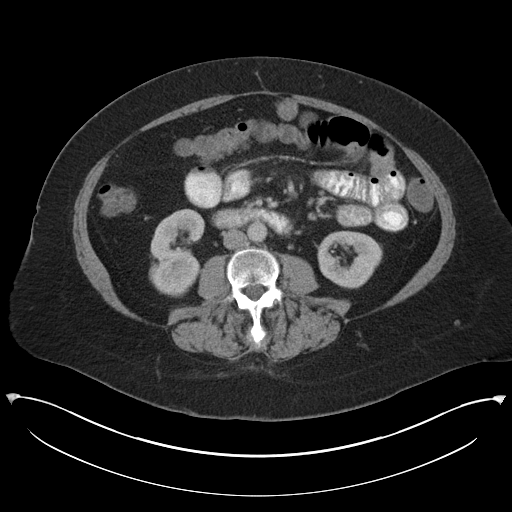
[im 53/91  soft-tissue]
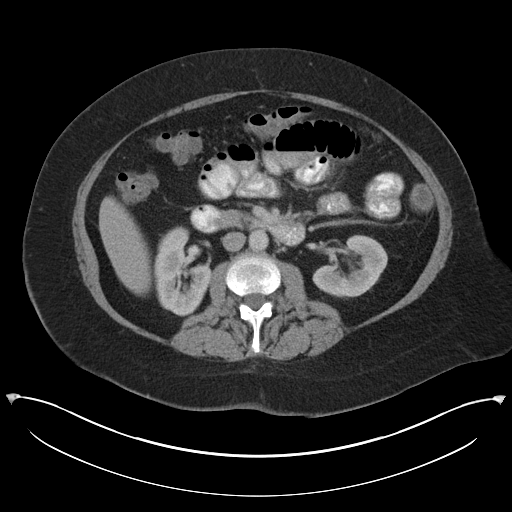
[im 57/91  soft-tissue]
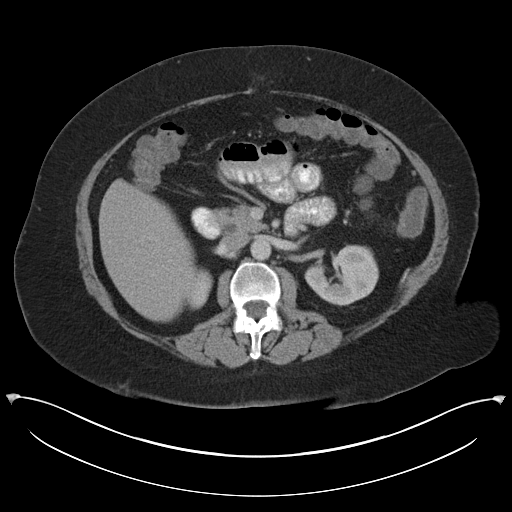
[im 57/91  bone]
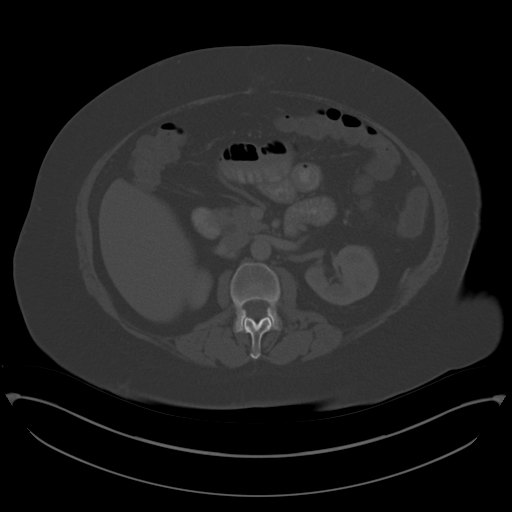
[im 67/91  soft-tissue]
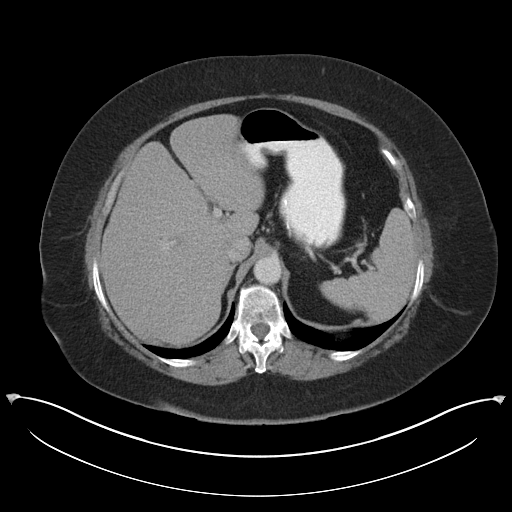
[im 72/91  soft-tissue]
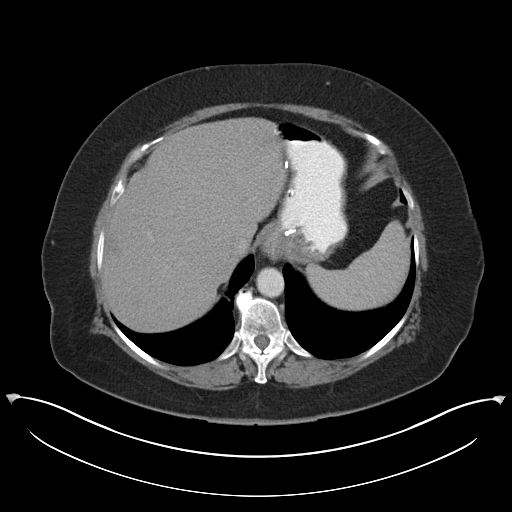
[im 72/91  lung]
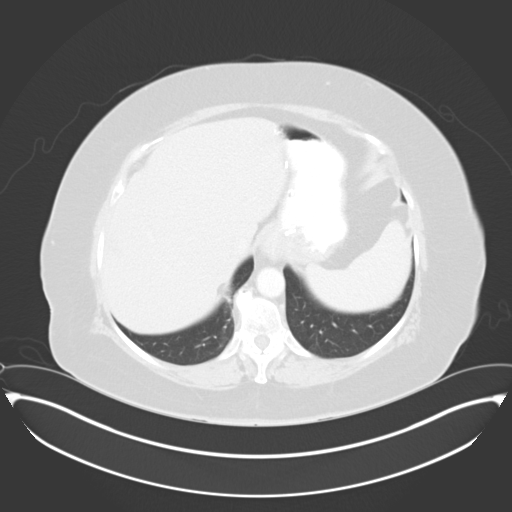
[im 76/91  soft-tissue]
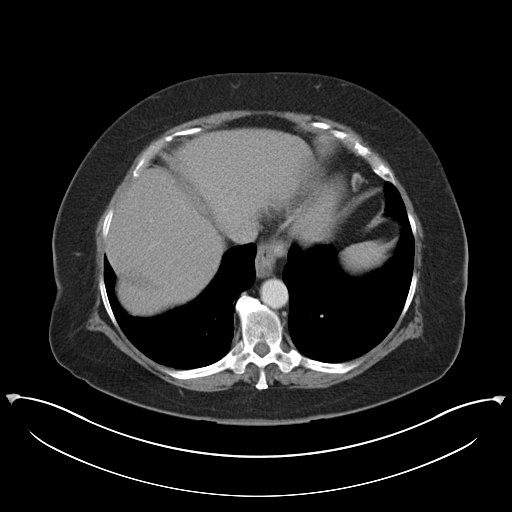
[im 76/91  lung]
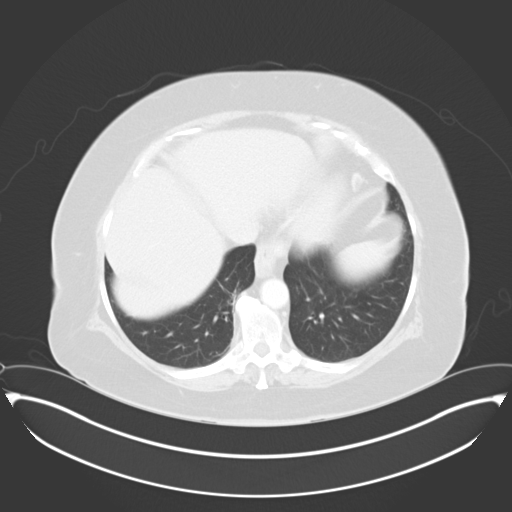
[im 81/91  lung]
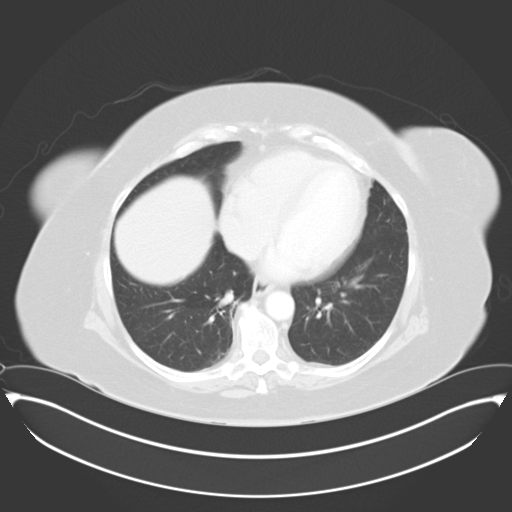
[im 86/91  soft-tissue]
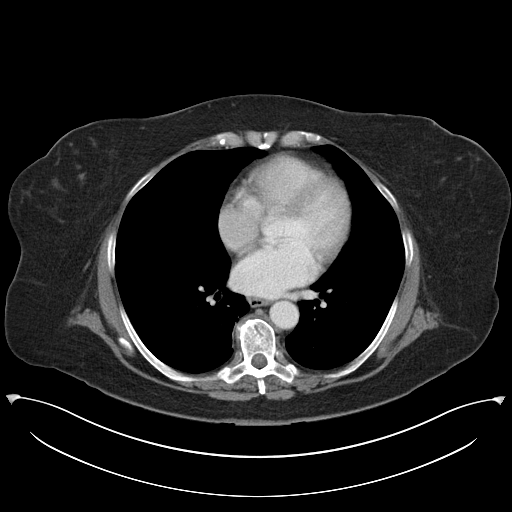
[im 86/91  lung]
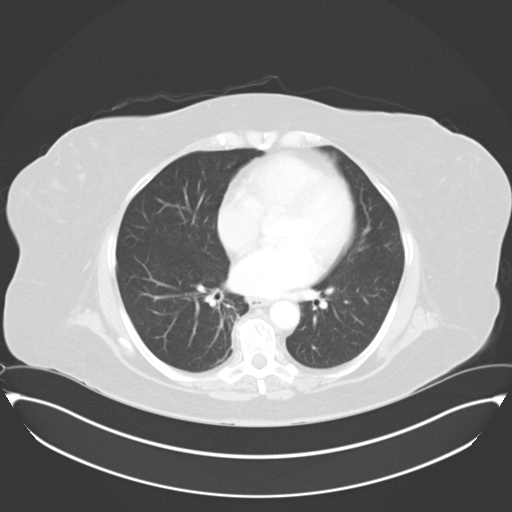

[14 of 32 positions shown; findings below may reference images not displayed]

FINDINGS: The lung bases are clear other than minimal atelectasis
in the lingula. There does appear to be a small hiatal hernia
present.  The liver enhances with no focal abnormality and no
ductal dilatation is seen.  Surgical clips are present from prior
cholecystectomy.  The pancreas is normal in size and the pancreatic
duct is not dilated.  The adrenal glands and spleen are
unremarkable, and a small splenule is noted.  The kidneys enhance
with no calculus or mass and no hydronephrosis is noted.  The
abdominal aorta is normal in caliber.  No adenopathy is seen.

There are dilated loops of small bowel into the left lower quadrant
- left upper pelvis where there does appear to be a change in
caliber.  The exact point of caliber change is difficult to
ascertain, but there is some angulation bowel loops and soft tissue
strandiness in the left lower quadrant and adhesion is the primary
consideration.  The distal small bowel loops are decompressed to
the ileocecal valve, and the colon is not distended.

The urinary bladder is unremarkable.  The uterus is normal in size.
There is some free fluid within the pelvis.  Rectosigmoid colonic
diverticula are noted.  The appendix is unremarkable. There is
degenerative change involving the facet joints of L4-5 and L5-S1.
IMPRESSION: 1.  Small bowel obstruction probably within the left lower quadrant
- left pelvis most likely due to an adhesion.  No mass is seen.
2.  Probable small hiatal hernia.
3.  Multiple rectosigmoid colonic diverticula.

## 2012-10-17 DIAGNOSIS — R413 Other amnesia: Secondary | ICD-10-CM | POA: Insufficient documentation

## 2014-09-04 DIAGNOSIS — R296 Repeated falls: Secondary | ICD-10-CM | POA: Insufficient documentation

## 2014-12-24 DIAGNOSIS — I10 Essential (primary) hypertension: Secondary | ICD-10-CM | POA: Insufficient documentation

## 2016-12-18 ENCOUNTER — Encounter: Payer: Self-pay | Admitting: Physician Assistant

## 2016-12-27 ENCOUNTER — Ambulatory Visit: Payer: No Typology Code available for payment source | Admitting: Physician Assistant

## 2017-02-28 ENCOUNTER — Encounter: Payer: Self-pay | Admitting: Physician Assistant

## 2017-03-06 ENCOUNTER — Encounter (INDEPENDENT_AMBULATORY_CARE_PROVIDER_SITE_OTHER): Payer: Self-pay

## 2017-03-06 ENCOUNTER — Encounter: Payer: Self-pay | Admitting: Physician Assistant

## 2017-03-06 ENCOUNTER — Ambulatory Visit (INDEPENDENT_AMBULATORY_CARE_PROVIDER_SITE_OTHER): Payer: Medicare Other | Admitting: Physician Assistant

## 2017-03-06 VITALS — BP 120/76 | Ht 63.5 in | Wt 198.0 lb

## 2017-03-06 DIAGNOSIS — R110 Nausea: Secondary | ICD-10-CM | POA: Diagnosis not present

## 2017-03-06 DIAGNOSIS — Z1211 Encounter for screening for malignant neoplasm of colon: Secondary | ICD-10-CM | POA: Diagnosis not present

## 2017-03-06 DIAGNOSIS — Z8719 Personal history of other diseases of the digestive system: Secondary | ICD-10-CM

## 2017-03-06 MED ORDER — ONDANSETRON HCL 4 MG PO TABS: 4.0000 mg | ORAL_TABLET | Freq: Four times a day (QID) | ORAL | 0 refills | Status: DC

## 2017-03-06 NOTE — Patient Instructions (Signed)
If you are age 70 or older, your body mass index should be between 23-30. Your Body mass index is 34.52 kg/m. If this is out of the aforementioned range listed, please consider follow up with your Primary Care Provider.  If you are age 36 or younger, your body mass index should be between 19-25. Your Body mass index is 34.52 kg/m. If this is out of the aformentioned range listed, please consider follow up with your Primary Care Provider.   We have sent the following medications to your pharmacy for you to pick up at your convenience: Zofran  Call back in June to get rescheduled for colonoscopy with Dr. Havery Moros.  Call back for any recurrence of abdominal pain, nausea, and vomiting.

## 2017-03-06 NOTE — Progress Notes (Signed)
Subjective:    Patient ID: [name], female    DOB: 1946-06-09, 71 y.o.   MRN: [id number]  HPI [name] is a pleasant 71 year old white female, new to GI today, self-referred. Patient has history of hiatal hernia, bipolar disorder, sleep apnea, fibromyalgia, she is status post several abdominal surgeries including cholecystectomy, C-section 2, prior gastric band which was subsequently reversed. Patient had thought she had been seen by one of our GI physicians when she was hospitalized in 2012, but review of records show she was hospitalized in September 2012 and followed by surgery,/Dr. Jacinto Reap and Dr. Rise Patience. She was admitted with a small bowel obstruction which resolved with conservative management. She reports prior colonoscopy being done at [facility] 9-10 years ago, and may have had small polyps. She comes in today because she had an episode about 8 days ago with severe upper abdominal pain which she describes as a very bad stomachache in bed most of the day. She says after several hours of pain she developed nausea and vomiting and vomited up a lot of material, this was then followed by loose stools and eventually diarrhea and then symptoms resolved within 24 hours. She says this was very similar to the episode she had when she was hospitalized. She hasn't had any problems between 2012 and now. She is concerned because she is going on a trip to [place] for the first time in May and doesn't want to be sick while she is there. At this point she is back to eating without much difficulty. Bowel movements are fairly normal for her and she has no complaints of pain.   She also describes symptoms over the past several months with some alteration in her balance which occurs intermittently and can be associated with nausea. She says she had one of those episodes today and when that happens her appetite is usually decreased as well but she has no other GI symptoms.  Review of Systems  Pertinent positive and negative review of systems were noted in the above HPI section.  All other review of systems was otherwise negative.  Outpatient Encounter Prescriptions as of 03/06/2017  Medication Sig  . carbamazepine (CARBATROL) 200 MG 12 hr capsule Take 200 mg by mouth 2 (two) times daily. Take 4 at bedtime  . clonazePAM (KLONOPIN) 0.5 MG tablet Take 0.5 mg by mouth 2 (two) times daily as needed for anxiety.  Marland Kitchen HYDROcodone-acetaminophen (NORCO) 10-325 MG tablet Take 1 tablet by mouth every 6 (six) hours as needed.  . lamoTRIgine (LAMICTAL) 200 MG tablet Take 200 mg by mouth daily.  Marland Kitchen lithium carbonate (ESKALITH) 450 MG CR tablet Take by mouth 2 (two) times daily.  Marland Kitchen losartan (COZAAR) 100 MG tablet Take 100 mg by mouth daily.  . pravastatin (PRAVACHOL) 10 MG tablet Take 10 mg by mouth daily.  . pregabalin (LYRICA) 150 MG capsule Take 150 mg by mouth 2 (two) times daily.  . traMADol (ULTRAM) 50 MG tablet Take 50 mg by mouth every 6 (six) hours as needed.  . ondansetron (ZOFRAN) 4 MG tablet Take 1 tablet (4 mg total) by mouth every 6 (six) hours.   No facility-administered encounter medications on file as of 03/06/2017.    Allergies  Allergen Reactions  . Penicillins    There are no active problems to display for this patient.  Social History   Social History  . Marital status: Married    Spouse name: N/A  . Number of children: N/A  . Years of education:  N/A   Occupational History  . Not on file.   Social History Main Topics  . Smoking status: Former Research scientist (life sciences)  . Smokeless tobacco: Not on file  . Alcohol use Not on file  . Drug use: Unknown  . Sexual activity: Not on file   Other Topics Concern  . Not on file   Social History Narrative  . No narrative on file    [name]'s family history is not on file.      Objective:    Vitals:   03/06/17 1054  BP: 120/76    Physical Exam  well-developed older white female in no acute distress, pleasant, blood pressure  120/76, height 5 foot 3, weight 198, BMI 34.5. HEENT; nontraumatic normocephalic EOMI PERRLA sclera anicteric, Cardiovascular; regular rate and rhythm with S1-S2 no murmur or gallop, Pulmonary; clear bilaterally, Abdomen ;large, soft, midline incisional scars, no palpable mass or hepatosplenomegaly basically nontender bowel sounds are present, Rectal; exam not done, Extremities ;no clubbing cyanosis or edema skin warm and dry, Neuropsych; mood and affect appropriate       Assessment & Plan:   #47 71 year old white female with history of partial small bowel obstruction 2012 for which she was admitted comes in now after a very similar episode occurring about a week ago with acute fairly severe upper abdominal pain lasting for several hours followed by multiple episodes of nausea and vomiting and then eventually loose stools. She's not had any recurrence of symptoms since. She is status post cholecystectomy, C-section 2 and prior gastric band which was subsequently reversed.  Her symptoms are very consistent with a transient partial small bowel obstruction  #2 Colon cancer surveillance, last colonoscopy about 10 years ago at [facility] unclear about polyps #3 sleep apnea #4 bipolar disorder  Plan; Will not pursue CT scan at this time as her symptoms have completely resolved but have advised her to call us should she have any recurrence of symptoms and would pursue CT scan of the abdomen and pelvis at that time. We reviewed management of recurrent symptoms with clear liquid diet and ER evaluation if she has symptoms lasting longer than 24 hours. We'll give her a prescription for Zofran 4 mg by mouth to use every 6 hours when necessary for nausea Advised she be evaluated by her PCP regarding her balance issues associated with nausea She will need colonoscopy. Have asked her to call back in June 2018 after she returns from [place] and at that time can schedule for colonoscopy with Dr.  Havery Moros.   [name] S Giamarie Bueche PA-C 03/06/2017   Cc: No ref. provider found

## 2017-03-07 NOTE — Progress Notes (Signed)
Agree with assessment and plan as outlined. It's possible she had partial SBO given her history, not sure if this is due to adhesive disease given her prior abdominal surgeries. I would have low threshold to repeat CT with any recurrence of symptoms.

## 2019-02-04 ENCOUNTER — Telehealth: Payer: Self-pay | Admitting: Internal Medicine

## 2019-02-04 NOTE — Telephone Encounter (Signed)
ROI faxed to [place]

## 2019-02-05 ENCOUNTER — Telehealth: Payer: Self-pay | Admitting: Gastroenterology

## 2019-02-05 NOTE — Telephone Encounter (Signed)
[facility] HIM Dept received 16 pages of Medical Records from [facility] Sending interoffice mail to [facility] 02/05/19  KLM

## 2019-02-13 NOTE — Telephone Encounter (Signed)
Records received and sent to Dr. Havery Moros

## 2019-05-14 ENCOUNTER — Encounter: Payer: Self-pay | Admitting: Internal Medicine

## 2019-06-09 DIAGNOSIS — R2689 Other abnormalities of gait and mobility: Secondary | ICD-10-CM | POA: Insufficient documentation

## 2019-06-09 DIAGNOSIS — M170 Bilateral primary osteoarthritis of knee: Secondary | ICD-10-CM | POA: Insufficient documentation

## 2019-06-18 ENCOUNTER — Encounter: Payer: Self-pay | Admitting: Neurology

## 2019-06-18 ENCOUNTER — Other Ambulatory Visit: Payer: Self-pay

## 2019-06-18 ENCOUNTER — Ambulatory Visit (INDEPENDENT_AMBULATORY_CARE_PROVIDER_SITE_OTHER): Payer: Medicare Other | Admitting: Neurology

## 2019-06-18 VITALS — BP 125/70 | HR 74 | Temp 97.7°F | Ht 64.5 in | Wt 221.6 lb

## 2019-06-18 DIAGNOSIS — M797 Fibromyalgia: Secondary | ICD-10-CM

## 2019-06-18 DIAGNOSIS — F319 Bipolar disorder, unspecified: Secondary | ICD-10-CM

## 2019-06-18 DIAGNOSIS — R413 Other amnesia: Secondary | ICD-10-CM | POA: Diagnosis not present

## 2019-06-18 DIAGNOSIS — R260 Ataxic gait: Secondary | ICD-10-CM | POA: Diagnosis not present

## 2019-06-18 DIAGNOSIS — R296 Repeated falls: Secondary | ICD-10-CM

## 2019-06-18 NOTE — Progress Notes (Signed)
[facility]  PATIENT: [name] DOB: 04-Aug-1946  REFERRING DOCTOR OR PCP:  Mohammed Kindle, MD SOURCE: patient, notes from PCP,   _________________________________   HISTORICAL  CHIEF COMPLAINT:  Chief Complaint  Patient presents with  . New Patient (Initial Visit)    RM 12, alone. Paper referral from Dr. Bayard Males for balance problem, diplopia. Sx are intermittent and sx do not last long. She has done PT in the past. Hard for her to stand up and move, legs do not move well. Has left knee pain (hydrocodone ineffective). Has some blurry vision, mostly at night. She watches a lot of TV.     HISTORY OF PRESENT ILLNESS: I had the pleasure of seeing your patient, [name], at [facility] for neurologic consultation regarding her balance problems and double vision  She is a 73 yo woman who had the onset on fluctuating difficulty with balance.   Symptoms would last a few hours and occur a couple times a month on average.   Associated with the reduced balance, she also have diplopia (offset).   She would stagger with some of the episodes, especially in the past.    The last full episode with ataxia and diplopia was a few months ago and she had an episode of diplopia x minutes last month.     She had a brain MRI 3 years ago.    She was told it showed 'Nothing acute but was not entirely normal' .   Even when an episode is not occurring, she feels he rbalance is poor and she is unsteady with eyes closed.    She used to walk 2 miles most days and now can go a block with a walker.    She had an MRI performed at [facility] 3 years ago when symptoms started.  We don't have the report or the images to review.    We will request these.  She has fibromyalgia and knee pain.   She is on hydrocodone and Lyrica.    Injections into her knee have not helped.     She has difficulty rising from a chair.   When she stands up, she is very stiff at first and gait is painful.       She is a former smoker (2 packs per week x 15 years).  She has HTN (Micardis and metoprolol).She has OSA and was on CPAP but felt claustophobia.   She has a longtime diagnosis of bipolar disorder.  She sees Dr. Toy Care and is on carbamezapine, lamotrigine and Latuda.   REVIEW OF SYSTEMS: Constitutional: No fevers, chills, sweats, or change in appetite Eyes: No visual changes, double vision, eye pain Ear, nose and throat: No hearing loss, ear pain, nasal congestion, sore throat Cardiovascular: No chest pain, palpitations Respiratory: No shortness of breath at rest or with exertion.   No wheezes GastrointestinaI: No nausea, vomiting, diarrhea, abdominal pain, fecal incontinence Genitourinary: No dysuria, urinary retention or frequency.  No nocturia. Musculoskeletal: No neck pain, back pain Integumentary: No rash, pruritus, skin lesions Neurological: as above Psychiatric: She has bipolar disease and sees Dr. Toy Care. Endocrine: No palpitations, diaphoresis, change in appetite, change in weigh or increased thirst Hematologic/Lymphatic: No anemia, purpura, petechiae. Allergic/Immunologic: No itchy/runny eyes, nasal congestion, recent allergic reactions, rashes  ALLERGIES: Allergies  Allergen Reactions  . Penicillins     HOME MEDICATIONS:  Current Outpatient Medications:  .  alendronate (FOSAMAX) 70 MG tablet, Take 70 mg by mouth once a week.  Take with a full glass of water on an empty stomach., Disp: , Rfl:  .  CALCIUM-VITAMIN D PO, Take 2 tablets by mouth daily., Disp: , Rfl:  .  carbamazepine (CARBATROL) 200 MG 12 hr capsule, Take 200 mg by mouth 2 (two) times daily. Take 4 at bedtime, Disp: , Rfl:  .  Glucosamine HCl (GLUCOSAMINE PO), Take 2,000 mg by mouth daily., Disp: , Rfl:  .  HYDROcodone-acetaminophen (NORCO) 10-325 MG tablet, Take 1 tablet by mouth every 6 (six) hours as needed., Disp: , Rfl:  .  lamoTRIgine (LAMICTAL) 200 MG tablet, Take 200 mg by mouth daily., Disp: , Rfl:   .  latanoprost (XALATAN) 0.005 % ophthalmic solution, Place 1 drop into both eyes at bedtime., Disp: , Rfl:  .  Lurasidone HCl (LATUDA) 120 MG TABS, Take 0.5 tablets by mouth daily., Disp: , Rfl:  .  metoprolol succinate (TOPROL-XL) 25 MG 24 hr tablet, Take 25 mg by mouth daily., Disp: , Rfl:  .  pravastatin (PRAVACHOL) 10 MG tablet, Take 10 mg by mouth daily., Disp: , Rfl:  .  pregabalin (LYRICA) 150 MG capsule, Take 150 mg by mouth 2 (two) times daily., Disp: , Rfl:  .  telmisartan (MICARDIS) 80 MG tablet, Take 80 mg by mouth daily., Disp: , Rfl:   PAST MEDICAL HISTORY: Past Medical History:  Diagnosis Date  . Anxiety   . Bipolar disorder ([place])   . Fibromyalgia   . Hypertension   . Obstructive sleep apnea   . Small bowel obstruction ([place])     PAST SURGICAL HISTORY: Past Surgical History:  Procedure Laterality Date  . CESAREAN SECTION    . CHOLECYSTECTOMY    . GASTRIC BYPASS    . TUBAL LIGATION      FAMILY HISTORY: History reviewed. No pertinent family history.  SOCIAL HISTORY:  Social History   Socioeconomic History  . Marital status: Married    Spouse name: Not on file  . Number of children: Not on file  . Years of education: Not on file  . Highest education level: Not on file  Occupational History  . Not on file  Social Needs  . Financial resource strain: Not on file  . Food insecurity    Worry: Not on file    Inability: Not on file  . Transportation needs    Medical: Not on file    Non-medical: Not on file  Tobacco Use  . Smoking status: Former Smoker  Substance and Sexual Activity  . Alcohol use: Not on file  . Drug use: Not on file  . Sexual activity: Not on file  Lifestyle  . Physical activity    Days per week: Not on file    Minutes per session: Not on file  . Stress: Not on file  Relationships  . Social Herbalist on phone: Not on file    Gets together: Not on file    Attends religious service: Not on file    Active member of club  or organization: Not on file    Attends meetings of clubs or organizations: Not on file    Relationship status: Not on file  . Intimate partner violence    Fear of current or ex partner: Not on file    Emotionally abused: Not on file    Physically abused: Not on file    Forced sexual activity: Not on file  Other Topics Concern  . Not on file  Social History Narrative  Right handed    Lives with spouse    Caffeine use: 2 cups coffee every morning   Ginger ale daily     PHYSICAL EXAM  Vitals:   06/18/19 1454  BP: 125/70  Pulse: 74  Temp: 97.7 F (36.5 C)  SpO2: 97%  Weight: 221 lb 9.6 oz (100.5 kg)  Height: 5' 4.5" (1.638 m)    Body mass index is 37.45 kg/m.   General: The patient is well-developed and well-nourished and in no acute distress.     HEENT:  Head is [place]/AT.  Sclera are anicteric.    Neck: No carotid bruits are noted.  The neck is non-tender with a slightly reduced ROM  Cardiovascular: The heart has a regular rate and rhythm with a normal S1 and S2. There were no murmurs, gallops or rubs.    Skin: Extremities are without rash or edema.   Neurologic Exam  Mental status: The patient is alert and oriented x 3 at the time of the examination. The patient has apparent normal recent and remote memory, with an apparently normal attention span and concentration ability.   Speech is normal.  Cranial nerves: Extraocular movements are full. Pupils are equal, round, and reactive to light and accomodation.  Color vision is symmetric.  Facial symmetry is present. There is good facial sensation to soft touch bilaterally.Facial strength is normal.  Trapezius and sternocleidomastoid strength is normal. No dysarthria is noted.  The tongue is midline, and the patient has symmetric elevation of the soft palate. No obvious hearing deficits are noted.  Motor:  Muscle bulk is normal.   Tone is normal. Strength is  5 / 5 in all 4 extremities.   Sensory: Sensory testing is intact  to pinprick, soft touch and vibration sensation in all 4 extremities.  Coordination: Cerebellar testing reveals good finger-nose-finger and heel-to-shin bilaterally.  Gait and station: Station is normal.   Gait is arthritic and mildly ataxic. Tandem gait is wide. Romberg is mildly positive   Reflexes: Deep tendon reflexes are symmetric and 2-3 bilaterally.   Plantar responses are flexor.      ASSESSMENT AND PLAN  1. Ataxic gait   2. Memory problem   3. Fibromyalgia   4. Bipolar affective disorder, remission status unspecified ([place])   5. Recurrent falls     In summary, Any Dewalt is a 73 year old woman who has had reduced balance for the past several years but has also had spells of worsening balance associated with diplopia.  She is concerned about multiple sclerosis and apparently she had an MRI in the past that was abnormal.  Unfortunately do not have the report or the images.  Being diagnosed after age 20 is rather rare so most likely there are alternative explanations.  I suspect that the abnormal MRI showed chronic microvascular ischemic change which would be much more likely for her age.  Regardless, since gait symptoms and episodes of poor balance and diplopia symptoms have persisted we need to check an MRI of the brain to determine if there is any evidence of demyelination or ischemic change and I will try to get the old MRI for comparison.  Additionally we need to check an MRI of the cervical spine to determine if there is a extrinsic myelopathy caused by spinal stenosis or other degenerative change or an intrinsic myelopathy which could explain her gait disturbance and mild hyperreflexia.  In the interim, I have advised her to be careful with her walking.  She often will use  a walker.  At least part of the difficulties with her gait is due to arthritic issues involving her knee and she is encouraged to follow-up with orthopedics.  She will return to see me in 3 months or sooner if  there are new or worsening neurologic symptoms.  Thank you for asking me to see Mrs. Curley.  Please let me know if I can be of further assistance with her or other patients in the future.  Levi Klaiber A. Felecia Shelling, MD, [facility] 02/11/9178, 1:50 PM Certified in Neurology, Clinical Neurophysiology, Sleep Medicine and Neuroimaging  [facility] [address], [place], [place] [address] [phone]

## 2019-06-19 ENCOUNTER — Telehealth: Payer: Self-pay | Admitting: Neurology

## 2019-06-19 ENCOUNTER — Telehealth: Payer: Self-pay | Admitting: *Deleted

## 2019-06-19 DIAGNOSIS — R260 Ataxic gait: Secondary | ICD-10-CM

## 2019-06-19 DIAGNOSIS — R296 Repeated falls: Secondary | ICD-10-CM

## 2019-06-19 LAB — SEDIMENTATION RATE: Sed Rate: 15 mm/hr (ref 0–40)

## 2019-06-19 LAB — VITAMIN B12: Vitamin B-12: 337 pg/mL (ref 232–1245)

## 2019-06-19 NOTE — Telephone Encounter (Signed)
Called and spoke with pt. Relayed labs fine per Dr. Felecia Shelling. Pt verbalized understanding. She asked about scheduling MRI. Advised she will be contacted to schedule once insurance approves.

## 2019-06-19 NOTE — Telephone Encounter (Signed)
medic[place] insurance order sent to GI. No auth they will reach out to the patient to schedule.

## 2019-06-19 NOTE — Telephone Encounter (Signed)
-----   Message from Britt Bottom, MD sent at 06/19/2019 12:20 PM EDT ----- Please let the patient know that the lab work is fine.

## 2019-07-02 ENCOUNTER — Ambulatory Visit
Admission: RE | Admit: 2019-07-02 | Discharge: 2019-07-02 | Disposition: A | Discharge status: Home / Self Care | Payer: Medicare Other | Source: Ambulatory Visit | Attending: Neurology | Admitting: Neurology

## 2019-07-02 DIAGNOSIS — R296 Repeated falls: Secondary | ICD-10-CM

## 2019-07-02 DIAGNOSIS — R260 Ataxic gait: Secondary | ICD-10-CM

## 2019-07-02 MED ORDER — GADOBENATE DIMEGLUMINE 529 MG/ML IV SOLN
20.0000 mL | Freq: Once | INTRAVENOUS | Status: AC | PRN
  Administered 2019-07-02: 20 mL via INTRAVENOUS

## 2019-07-07 ENCOUNTER — Telehealth: Payer: Self-pay | Admitting: *Deleted

## 2019-07-07 NOTE — Telephone Encounter (Signed)
-----   Message from Britt Bottom, MD sent at 07/03/2019  5:33 PM EDT ----- pleae let her know the MRi shows mild age related changes but nothing that should affect the gait;

## 2019-07-07 NOTE — Telephone Encounter (Signed)
Called, LVM for pt about results (ok per DPR). Gave [facility] phone number if she has further questions/concerns.

## 2019-11-28 ENCOUNTER — Encounter: Payer: Self-pay | Admitting: Internal Medicine

## 2022-03-31 ENCOUNTER — Encounter: Payer: Self-pay | Admitting: Internal Medicine

## 2022-05-12 ENCOUNTER — Ambulatory Visit (AMBULATORY_SURGERY_CENTER): Payer: Medicare Other | Admitting: *Deleted

## 2022-05-12 VITALS — Ht 64.0 in | Wt 211.0 lb

## 2022-05-12 DIAGNOSIS — Z1211 Encounter for screening for malignant neoplasm of colon: Secondary | ICD-10-CM

## 2022-05-12 MED ORDER — PEG 3350-KCL-NA BICARB-NACL 420 G PO SOLR: 4000.0000 mL | Freq: Once | ORAL | 0 refills | Status: AC

## 2022-05-12 NOTE — Progress Notes (Signed)
Pt's previsit is done over the phone and all paperwork (prep instructions, blank consent form to just read over) sent to patient.  Pt's name and DOB verified at the beginning of the previsit.  Pt denies any difficulty with ambulating.    No egg or soy allergy  No home oxygen use   No medications for weight loss taken  emmi information given  Pt denies constipation issues- she takes Hydrocodone 5 times a day but denies ever having constipation issues.  States, "I just take Benefiber to increase my fivber intake."  Pt informed that we do not do prior authorizations for prep

## 2022-06-05 ENCOUNTER — Encounter: Payer: Medicare Other | Admitting: Internal Medicine

## 2022-06-07 ENCOUNTER — Encounter: Payer: Medicare Other | Admitting: Gastroenterology

## 2022-06-21 ENCOUNTER — Encounter: Payer: Self-pay | Admitting: Certified Registered Nurse Anesthetist

## 2022-06-27 ENCOUNTER — Ambulatory Visit (AMBULATORY_SURGERY_CENTER): Payer: Medicare Other | Admitting: Gastroenterology

## 2022-06-27 ENCOUNTER — Encounter: Payer: Self-pay | Admitting: Gastroenterology

## 2022-06-27 VITALS — BP 139/70 | HR 59 | Temp 97.7°F | Resp 13 | Ht 64.0 in | Wt 211.0 lb

## 2022-06-27 DIAGNOSIS — D120 Benign neoplasm of cecum: Secondary | ICD-10-CM | POA: Diagnosis not present

## 2022-06-27 DIAGNOSIS — D125 Benign neoplasm of sigmoid colon: Secondary | ICD-10-CM | POA: Diagnosis not present

## 2022-06-27 DIAGNOSIS — Z1211 Encounter for screening for malignant neoplasm of colon: Secondary | ICD-10-CM | POA: Diagnosis not present

## 2022-06-27 MED ORDER — SODIUM CHLORIDE 0.9 % IV SOLN: 500.0000 mL | Freq: Once | INTRAVENOUS | Status: DC

## 2022-06-27 NOTE — Progress Notes (Signed)
[facility] History and Physical   Primary Care Physician:  Cathie Olden, MD   Reason for Procedure:   Colon cancer screening  Plan:    colonoscopy     HPI: [name] is a 76 y.o. female  here for colonoscopy screening - last exam she thinks > 12 years ago.. Patient denies any bowel symptoms at this time, but she did have a few episodes of fecal incontinence in the past year. Taking fiber periodically which has resolved that issue. No family history of colon cancer known. Otherwise feels well without any cardiopulmonary symptoms.    Past Medical History:  Diagnosis Date   Allergy    Anxiety    Arthritis    Bipolar disorder (HCC)    Fibromyalgia    Glaucoma    Hyperlipidemia    Hypertension    Obstructive sleep apnea    no CPAP   Osteopenia    Right bundle branch block    Sleep apnea    Small bowel obstruction (HCC)     Past Surgical History:  Procedure Laterality Date   CATARACT EXTRACTION Bilateral    CESAREAN SECTION     CHOLECYSTECTOMY     COLONOSCOPY     GASTRIC BYPASS     TUBAL LIGATION      Prior to Admission medications   Medication Sig Start Date End Date Taking? Authorizing Provider  carbamazepine (CARBATROL) 200 MG 12 hr capsule Take 200 mg by mouth 2 (two) times daily. Take 4 at bedtime   Yes [provider]  carbamazepine (TEGRETOL) 200 MG tablet Take 4 tablets by mouth at bedtime.   Yes [provider]  Cholecalciferol (VITAMIN D3 PO) Take by mouth daily.   Yes [provider]  dorzolamide-timolol (COSOPT) 22.3-6.8 MG/ML ophthalmic solution Apply to eye daily. 08/10/21  Yes [provider]  folic acid (FOLVITE) 1 MG tablet daily. 09/28/21  Yes [provider]  Glucosamine HCl (GLUCOSAMINE PO) Take 2,000 mg by mouth daily.   Yes [provider]  hydrALAZINE (APRESOLINE) 25 MG tablet Take by mouth 3 (three) times daily. 08/10/21  Yes [provider]   HYDROcodone-acetaminophen (NORCO) 10-325 MG tablet Take 1 tablet by mouth every 6 (six) hours as needed. Takes 5 tablets daily   Yes [provider]  lamoTRIgine (LAMICTAL) 200 MG tablet Take 200 mg by mouth daily.   Yes [provider]  latanoprost (XALATAN) 0.005 % ophthalmic solution Place 1 drop into both eyes at bedtime.   Yes [provider]  Lurasidone HCl (LATUDA) 120 MG TABS Take 0.5 tablets by mouth daily.   Yes [provider]  metoprolol succinate (TOPROL-XL) 25 MG 24 hr tablet Take 100 mg by mouth daily.   Yes [provider]  Netarsudil Dimesylate (RHOPRESSA) 0.02 % SOLN Rhopressa 0.02 % eye drops  INSTILL 1 DROP INTO RIGHT EYE EVERY EVENING 05/26/21  Yes [provider]  pravastatin (PRAVACHOL) 10 MG tablet Take 10 mg by mouth daily.   Yes [provider]  pregabalin (LYRICA) 150 MG capsule Take 200 mg by mouth 2 (two) times daily.   Yes [provider]  telmisartan (MICARDIS) 80 MG tablet Take 80 mg by mouth daily.   Yes [provider]  alendronate (FOSAMAX) 70 MG tablet Take 70 mg by mouth once a week. Take with a full glass of water on an empty stomach. Patient not taking: Reported on 05/12/2022    [provider]  CALCIUM-VITAMIN D PO Take 2  tablets by mouth daily. Patient not taking: Reported on 05/12/2022    [provider]  LORazepam (ATIVAN) 1 MG tablet as needed.    [provider]  naloxone [facility]) nasal spray 4 mg/0.1 mL as needed. 08/31/21   [provider]  Wheat Dextrin (BENEFIBER PO) Take by mouth. Every other day    [provider]  zolpidem (AMBIEN) 10 MG tablet Take 10 mg by mouth at bedtime as needed. 06/10/22   [provider]    Current Outpatient Medications  Medication Sig Dispense Refill   carbamazepine (CARBATROL) 200 MG 12 hr capsule Take 200 mg by mouth 2 (two) times daily. Take 4 at bedtime     carbamazepine (TEGRETOL) 200  MG tablet Take 4 tablets by mouth at bedtime.     Cholecalciferol (VITAMIN D3 PO) Take by mouth daily.     dorzolamide-timolol (COSOPT) 22.3-6.8 MG/ML ophthalmic solution Apply to eye daily.     folic acid (FOLVITE) 1 MG tablet daily.     Glucosamine HCl (GLUCOSAMINE PO) Take 2,000 mg by mouth daily.     hydrALAZINE (APRESOLINE) 25 MG tablet Take by mouth 3 (three) times daily.     HYDROcodone-acetaminophen (NORCO) 10-325 MG tablet Take 1 tablet by mouth every 6 (six) hours as needed. Takes 5 tablets daily     lamoTRIgine (LAMICTAL) 200 MG tablet Take 200 mg by mouth daily.     latanoprost (XALATAN) 0.005 % ophthalmic solution Place 1 drop into both eyes at bedtime.     Lurasidone HCl (LATUDA) 120 MG TABS Take 0.5 tablets by mouth daily.     metoprolol succinate (TOPROL-XL) 25 MG 24 hr tablet Take 100 mg by mouth daily.     Netarsudil Dimesylate (RHOPRESSA) 0.02 % SOLN Rhopressa 0.02 % eye drops  INSTILL 1 DROP INTO RIGHT EYE EVERY EVENING     pravastatin (PRAVACHOL) 10 MG tablet Take 10 mg by mouth daily.     pregabalin (LYRICA) 150 MG capsule Take 200 mg by mouth 2 (two) times daily.     telmisartan (MICARDIS) 80 MG tablet Take 80 mg by mouth daily.     alendronate (FOSAMAX) 70 MG tablet Take 70 mg by mouth once a week. Take with a full glass of water on an empty stomach. (Patient not taking: Reported on 05/12/2022)     CALCIUM-VITAMIN D PO Take 2 tablets by mouth daily. (Patient not taking: Reported on 05/12/2022)     LORazepam (ATIVAN) 1 MG tablet as needed.     naloxone (NARCAN) nasal spray 4 mg/0.1 mL as needed.     Wheat Dextrin (BENEFIBER PO) Take by mouth. Every other day     zolpidem (AMBIEN) 10 MG tablet Take 10 mg by mouth at bedtime as needed.     Current Facility-Administered Medications  Medication Dose Route Frequency Provider Last Rate Last Admin   0.9 %  sodium chloride infusion  500 mL Intravenous Once Tudor Chandley, Carlota Raspberry, MD        Allergies as of 06/27/2022 - Review  Complete 06/27/2022  Allergen Reaction Noted   Sulfa antibiotics  05/06/2020   Lisinopril  05/12/2022   Morphine  05/12/2022   Penicillins  02/28/2017    History reviewed. No pertinent family history.  Social History   Socioeconomic History   Marital status: Married    Spouse name: Not on file   Number of children: Not on file   Years of education: Not on file   Highest education level: Not on  file  Occupational History   Not on file  Tobacco Use   Smoking status: Former   Smokeless tobacco: Not on file  Substance and Sexual Activity   Alcohol use: Not on file   Drug use: Not on file   Sexual activity: Not on file  Other Topics Concern   Not on file  Social History Narrative   Right handed    Lives with spouse    Caffeine use: 2 cups coffee every morning   Ginger ale daily   Social Determinants of Health   Financial Resource Strain: Not on file  Food Insecurity: Not on file  Transportation Needs: Not on file  Physical Activity: Not on file  Stress: Not on file  Social Connections: Not on file  Intimate Partner Violence: Not on file    Review of Systems: All other review of systems negative except as mentioned in the HPI.  Physical Exam: Vital signs BP (!) 172/90   Pulse 68   Temp 97.7 F (36.5 C)   Resp 11   Ht '5\' 4"'$  (1.626 m)   Wt 211 lb (95.7 kg)   SpO2 99%   BMI 36.22 kg/m   General:   Alert,  Well-developed, pleasant and cooperative in NAD Lungs:  Clear throughout to auscultation.   Heart:  Regular rate and rhythm Abdomen:  Soft, nontender and nondistended.   Neuro/Psych:  Alert and cooperative. Normal mood and affect. A and O x 3  Jolly Mango, MD [facility]

## 2022-06-27 NOTE — Progress Notes (Signed)
Report given to PACU, vss 

## 2022-06-27 NOTE — Patient Instructions (Addendum)
Information on polyps, diverticulosis, and hemorrhoids given to you today.  Await pathology results.  Resume previous diet and medication.   YOU HAD AN ENDOSCOPIC PROCEDURE TODAY AT [facility]:   Refer to the procedure report that was given to you for any specific questions about what was found during the examination.  If the procedure report does not answer your questions, please call your gastroenterologist to clarify.  If you requested that your care partner not be given the details of your procedure findings, then the procedure report has been included in a sealed envelope for you to review at your convenience later.  YOU SHOULD EXPECT: Some feelings of bloating in the abdomen. Passage of more gas than usual.  Walking can help get rid of the air that was put into your GI tract during the procedure and reduce the bloating. If you had a lower endoscopy (such as a colonoscopy or flexible sigmoidoscopy) you may notice spotting of blood in your stool or on the toilet paper. If you underwent a bowel prep for your procedure, you may not have a normal bowel movement for a few days.  Please Note:  You might notice some irritation and congestion in your nose or some drainage.  This is from the oxygen used during your procedure.  There is no need for concern and it should clear up in a day or so.  SYMPTOMS TO REPORT IMMEDIATELY:  Following lower endoscopy (colonoscopy or flexible sigmoidoscopy):  Excessive amounts of blood in the stool  Significant tenderness or worsening of abdominal pains  Swelling of the abdomen that is new, acute  Fever of 100F or higher   For urgent or emergent issues, a gastroenterologist can be reached at any hour by calling [phone]. Do not use MyChart messaging for urgent concerns.    DIET:  We do recommend a small meal at first, but then you may proceed to your regular diet.  Drink plenty of fluids but you should avoid alcoholic beverages for 24  hours.  ACTIVITY:  You should plan to take it easy for the rest of today and you should NOT DRIVE or use heavy machinery until tomorrow (because of the sedation medicines used during the test).    FOLLOW UP: Our staff will call the number listed on your records the next business day following your procedure.  We will call around 7:15- 8:00 am to check on you and address any questions or concerns that you may have regarding the information given to you following your procedure. If we do not reach you, we will leave a message.  If you develop any symptoms (ie: fever, flu-like symptoms, shortness of breath, cough etc.) before then, please call [phone].  If you test positive for Covid 19 in the 2 weeks post procedure, please call and report this information to [place].    If any biopsies were taken you will be contacted by phone or by letter within the next 1-3 weeks.  Please call us at [phone] if you have not heard about the biopsies in 3 weeks.    SIGNATURES/CONFIDENTIALITY: You and/or your care partner have signed paperwork which will be entered into your electronic medical record.  These signatures attest to the fact that that the information above on your After Visit Summary has been reviewed and is understood.  Full responsibility of the confidentiality of this discharge information lies with you and/or your care-partner.

## 2022-06-27 NOTE — Progress Notes (Signed)
Pt's states no medical or surgical changes since previsit or office visit. 

## 2022-06-27 NOTE — Op Note (Signed)
[place] Patient Name: [name] Procedure Date: 06/27/2022 2:57 PM MRN: [id number] Endoscopist: Remo Lipps P. Havery Moros , MD Age: 76 Referring MD:  Date of Birth: January 31, 1946 Gender: Female Account #: [id number] Procedure:                Colonoscopy Indications:              Screening for colorectal malignant neoplasm Medicines:                Monitored Anesthesia Care Procedure:                Pre-Anesthesia Assessment:                           - Prior to the procedure, a History and Physical                            was performed, and patient medications and                            allergies were reviewed. The patient's tolerance of                            previous anesthesia was also reviewed. The risks                            and benefits of the procedure and the sedation                            options and risks were discussed with the patient.                            All questions were answered, and informed consent                            was obtained. Prior Anticoagulants: The patient has                            taken no previous anticoagulant or antiplatelet                            agents. ASA Grade Assessment: II - A patient with                            mild systemic disease. After reviewing the risks                            and benefits, the patient was deemed in                            satisfactory condition to undergo the procedure.                           After obtaining informed consent, the colonoscope  was passed under direct vision. Throughout the                            procedure, the patient's blood pressure, pulse, and                            oxygen saturations were monitored continuously. The                            Olympus PCF-[id number] (#[id number]) Colonoscope was                            introduced through the anus and advanced to the the                            cecum,  identified by appendiceal orifice and                            ileocecal valve. The colonoscopy was performed                            without difficulty. The patient tolerated the                            procedure well. The quality of the bowel                            preparation was good. The ileocecal valve,                            appendiceal orifice, and rectum were photographed. Scope In: 3:16:28 PM Scope Out: 3:35:14 PM Scope Withdrawal Time: 0 hours 14 minutes 38 seconds  Total Procedure Duration: 0 hours 18 minutes 46 seconds  Findings:                 The perianal and digital rectal examinations were                            normal.                           Three sessile polyps were found in the cecum. The                            polyps were 2 to 4 mm in size. These polyps were                            removed with a cold snare. Resection and retrieval                            were complete.                           A 3 mm polyp was found in the sigmoid colon. The  polyp was sessile. The polyp was removed with a                            cold snare. Resection and retrieval were complete.                           Multiple small-mouthed diverticula were found in                            the sigmoid colon.                           Internal hemorrhoids were found during                            retroflexion. The hemorrhoids were small.                           The exam was otherwise without abnormality. Complications:            No immediate complications. Estimated blood loss:                            Minimal. Estimated Blood Loss:     Estimated blood loss was minimal. Impression:               - Three 2 to 4 mm polyps in the cecum, removed with                            a cold snare. Resected and retrieved.                           - One 3 mm polyp in the sigmoid colon, removed with                            a cold  snare. Resected and retrieved.                           - Diverticulosis in the sigmoid colon.                           - Internal hemorrhoids.                           - The examination was otherwise normal. Recommendation:           - Patient has a contact number available for                            emergencies. The signs and symptoms of potential                            delayed complications were discussed with the                            patient. Return to normal activities  tomorrow.                            Written discharge instructions were provided to the                            patient.                           - Resume previous diet.                           - Continue present medications.                           - Await pathology results. Remo Lipps P. [name], MD 06/27/2022 3:41:43 PM This report has been signed electronically.

## 2022-06-27 NOTE — Progress Notes (Signed)
Called to room to assist during endoscopic procedure.  Patient ID and intended procedure confirmed with present staff. Received instructions for my participation in the procedure from the performing physician.  

## 2022-06-28 ENCOUNTER — Telehealth: Payer: Self-pay | Admitting: *Deleted

## 2022-06-28 NOTE — Telephone Encounter (Signed)
Attempt for follow up phone call. No answer at number given.  Left message on voicemail.
